# Patient Record
Sex: Female | Born: 1945 | Race: Black or African American | Hispanic: No | Marital: Married | Smoking: Never smoker
Health system: Southern US, Community
[De-identification: ages and names within clinical notes are randomized; demographics above are authoritative.]

## PROBLEM LIST (undated history)

## (undated) DIAGNOSIS — N200 Calculus of kidney: Secondary | ICD-10-CM

## (undated) DIAGNOSIS — M1990 Unspecified osteoarthritis, unspecified site: Secondary | ICD-10-CM

## (undated) DIAGNOSIS — N952 Postmenopausal atrophic vaginitis: Secondary | ICD-10-CM

## (undated) DIAGNOSIS — K5792 Diverticulitis of intestine, part unspecified, without perforation or abscess without bleeding: Secondary | ICD-10-CM

## (undated) DIAGNOSIS — I10 Essential (primary) hypertension: Secondary | ICD-10-CM

## (undated) DIAGNOSIS — N816 Rectocele: Secondary | ICD-10-CM

## (undated) DIAGNOSIS — M8580 Other specified disorders of bone density and structure, unspecified site: Secondary | ICD-10-CM

## (undated) DIAGNOSIS — I8390 Asymptomatic varicose veins of unspecified lower extremity: Secondary | ICD-10-CM

## (undated) DIAGNOSIS — Z201 Contact with and (suspected) exposure to tuberculosis: Secondary | ICD-10-CM

## (undated) DIAGNOSIS — E039 Hypothyroidism, unspecified: Secondary | ICD-10-CM

## (undated) HISTORY — DX: Contact with and (suspected) exposure to tuberculosis: Z20.1

## (undated) HISTORY — DX: Other specified disorders of bone density and structure, unspecified site: M85.80

## (undated) HISTORY — DX: Postmenopausal atrophic vaginitis: N95.2

## (undated) HISTORY — DX: Rectocele: N81.6

## (undated) HISTORY — DX: Calculus of kidney: N20.0

## (undated) HISTORY — DX: Essential (primary) hypertension: I10

## (undated) HISTORY — DX: Asymptomatic varicose veins of unspecified lower extremity: I83.90

## (undated) HISTORY — DX: Diverticulitis of intestine, part unspecified, without perforation or abscess without bleeding: K57.92

## (undated) HISTORY — PX: OTHER SURGICAL HISTORY: SHX169

## (undated) HISTORY — DX: Unspecified osteoarthritis, unspecified site: M19.90

## (undated) HISTORY — DX: Hypothyroidism, unspecified: E03.9

## (undated) HISTORY — PX: CHOLECYSTECTOMY: SHX55

## (undated) HISTORY — PX: TUBAL LIGATION: SHX77

---

## 1998-02-05 ENCOUNTER — Ambulatory Visit (HOSPITAL_COMMUNITY): Admission: RE | Admit: 1998-02-05 | Discharge: 1998-02-05 | Payer: Self-pay | Admitting: Gastroenterology

## 1998-04-10 ENCOUNTER — Encounter: Payer: Self-pay | Admitting: Emergency Medicine

## 1998-04-10 ENCOUNTER — Emergency Department (HOSPITAL_COMMUNITY): Admission: EM | Admit: 1998-04-10 | Discharge: 1998-04-10 | Payer: Self-pay | Admitting: Emergency Medicine

## 1998-04-11 ENCOUNTER — Encounter: Payer: Self-pay | Admitting: Emergency Medicine

## 1998-07-26 ENCOUNTER — Observation Stay (HOSPITAL_COMMUNITY): Admission: RE | Admit: 1998-07-26 | Discharge: 1998-07-27 | Payer: Self-pay

## 1999-04-16 ENCOUNTER — Encounter: Payer: Self-pay | Admitting: Obstetrics and Gynecology

## 1999-04-16 ENCOUNTER — Encounter: Admission: RE | Admit: 1999-04-16 | Discharge: 1999-04-16 | Payer: Self-pay | Admitting: Obstetrics and Gynecology

## 2000-07-01 ENCOUNTER — Encounter: Admission: RE | Admit: 2000-07-01 | Discharge: 2000-07-01 | Payer: Self-pay | Admitting: Obstetrics and Gynecology

## 2000-07-01 ENCOUNTER — Encounter: Payer: Self-pay | Admitting: Obstetrics and Gynecology

## 2001-07-19 ENCOUNTER — Encounter: Payer: Self-pay | Admitting: Obstetrics and Gynecology

## 2001-07-19 ENCOUNTER — Encounter: Admission: RE | Admit: 2001-07-19 | Discharge: 2001-07-19 | Payer: Self-pay | Admitting: Obstetrics and Gynecology

## 2001-07-30 ENCOUNTER — Other Ambulatory Visit: Admission: RE | Admit: 2001-07-30 | Discharge: 2001-07-30 | Payer: Self-pay | Admitting: Obstetrics and Gynecology

## 2001-08-31 ENCOUNTER — Encounter: Admission: RE | Admit: 2001-08-31 | Discharge: 2001-08-31 | Payer: Self-pay | Admitting: Obstetrics and Gynecology

## 2001-08-31 ENCOUNTER — Encounter: Payer: Self-pay | Admitting: Obstetrics and Gynecology

## 2002-04-13 ENCOUNTER — Encounter: Admission: RE | Admit: 2002-04-13 | Discharge: 2002-04-13 | Payer: Self-pay | Admitting: Family Medicine

## 2002-04-13 ENCOUNTER — Encounter: Payer: Self-pay | Admitting: Family Medicine

## 2002-05-16 ENCOUNTER — Ambulatory Visit (HOSPITAL_COMMUNITY): Admission: RE | Admit: 2002-05-16 | Discharge: 2002-05-16 | Payer: Self-pay | Admitting: Gastroenterology

## 2002-07-15 ENCOUNTER — Encounter: Admission: RE | Admit: 2002-07-15 | Discharge: 2002-07-15 | Payer: Self-pay | Admitting: Family Medicine

## 2002-07-15 ENCOUNTER — Encounter: Payer: Self-pay | Admitting: Family Medicine

## 2002-11-02 ENCOUNTER — Encounter: Payer: Self-pay | Admitting: Obstetrics and Gynecology

## 2002-11-02 ENCOUNTER — Encounter: Admission: RE | Admit: 2002-11-02 | Discharge: 2002-11-02 | Payer: Self-pay | Admitting: Obstetrics and Gynecology

## 2003-06-16 ENCOUNTER — Other Ambulatory Visit: Admission: RE | Admit: 2003-06-16 | Discharge: 2003-06-16 | Payer: Self-pay | Admitting: Obstetrics and Gynecology

## 2004-02-13 ENCOUNTER — Encounter: Admission: RE | Admit: 2004-02-13 | Discharge: 2004-02-13 | Payer: Self-pay | Admitting: Obstetrics and Gynecology

## 2004-06-17 ENCOUNTER — Other Ambulatory Visit: Admission: RE | Admit: 2004-06-17 | Discharge: 2004-06-17 | Payer: Self-pay | Admitting: Addiction Medicine

## 2004-07-12 ENCOUNTER — Encounter: Admission: RE | Admit: 2004-07-12 | Discharge: 2004-07-12 | Payer: Self-pay | Admitting: Family Medicine

## 2005-04-22 ENCOUNTER — Encounter: Admission: RE | Admit: 2005-04-22 | Discharge: 2005-04-22 | Payer: Self-pay | Admitting: Obstetrics and Gynecology

## 2005-05-26 ENCOUNTER — Encounter: Admission: RE | Admit: 2005-05-26 | Discharge: 2005-05-26 | Payer: Self-pay | Admitting: Family Medicine

## 2005-06-25 ENCOUNTER — Other Ambulatory Visit: Admission: RE | Admit: 2005-06-25 | Discharge: 2005-06-25 | Payer: Self-pay | Admitting: Obstetrics and Gynecology

## 2005-11-04 ENCOUNTER — Encounter: Admission: RE | Admit: 2005-11-04 | Discharge: 2005-11-04 | Payer: Self-pay | Admitting: Gastroenterology

## 2006-03-05 ENCOUNTER — Encounter: Admission: RE | Admit: 2006-03-05 | Discharge: 2006-03-05 | Payer: Self-pay | Admitting: Ophthalmology

## 2006-03-31 ENCOUNTER — Encounter (INDEPENDENT_AMBULATORY_CARE_PROVIDER_SITE_OTHER): Payer: Self-pay | Admitting: Infectious Diseases

## 2006-04-23 ENCOUNTER — Encounter (INDEPENDENT_AMBULATORY_CARE_PROVIDER_SITE_OTHER): Payer: Self-pay | Admitting: Infectious Diseases

## 2006-05-13 ENCOUNTER — Ambulatory Visit: Payer: Self-pay | Admitting: Infectious Diseases

## 2006-05-13 DIAGNOSIS — B908 Sequelae of tuberculosis of other organs: Secondary | ICD-10-CM

## 2006-05-15 ENCOUNTER — Encounter: Admission: RE | Admit: 2006-05-15 | Discharge: 2006-05-15 | Payer: Self-pay | Admitting: Obstetrics and Gynecology

## 2006-07-07 ENCOUNTER — Other Ambulatory Visit: Admission: RE | Admit: 2006-07-07 | Discharge: 2006-07-07 | Payer: Self-pay | Admitting: Obstetrics and Gynecology

## 2007-06-29 ENCOUNTER — Encounter: Admission: RE | Admit: 2007-06-29 | Discharge: 2007-06-29 | Payer: Self-pay | Admitting: Family Medicine

## 2007-07-09 ENCOUNTER — Other Ambulatory Visit: Admission: RE | Admit: 2007-07-09 | Discharge: 2007-07-09 | Payer: Self-pay | Admitting: Obstetrics and Gynecology

## 2007-07-21 ENCOUNTER — Encounter: Admission: RE | Admit: 2007-07-21 | Discharge: 2007-07-21 | Payer: Self-pay | Admitting: Family Medicine

## 2007-08-04 ENCOUNTER — Encounter: Admission: RE | Admit: 2007-08-04 | Discharge: 2007-08-04 | Payer: Self-pay | Admitting: Family Medicine

## 2007-12-20 ENCOUNTER — Ambulatory Visit: Payer: Self-pay | Admitting: Obstetrics and Gynecology

## 2008-07-12 ENCOUNTER — Other Ambulatory Visit: Admission: RE | Admit: 2008-07-12 | Discharge: 2008-07-12 | Payer: Self-pay | Admitting: Obstetrics and Gynecology

## 2008-07-12 ENCOUNTER — Encounter: Payer: Self-pay | Admitting: Obstetrics and Gynecology

## 2008-07-12 ENCOUNTER — Ambulatory Visit: Payer: Self-pay | Admitting: Obstetrics and Gynecology

## 2008-07-17 ENCOUNTER — Encounter: Admission: RE | Admit: 2008-07-17 | Discharge: 2008-07-17 | Payer: Self-pay | Admitting: Obstetrics and Gynecology

## 2009-07-18 ENCOUNTER — Ambulatory Visit: Payer: Self-pay | Admitting: Obstetrics and Gynecology

## 2009-07-19 ENCOUNTER — Encounter: Admission: RE | Admit: 2009-07-19 | Discharge: 2009-07-19 | Payer: Self-pay | Admitting: Family Medicine

## 2009-10-09 ENCOUNTER — Ambulatory Visit: Payer: Self-pay | Admitting: Obstetrics and Gynecology

## 2010-03-17 ENCOUNTER — Encounter: Payer: Self-pay | Admitting: Obstetrics and Gynecology

## 2010-04-09 ENCOUNTER — Inpatient Hospital Stay (INDEPENDENT_AMBULATORY_CARE_PROVIDER_SITE_OTHER)
Admission: RE | Admit: 2010-04-09 | Discharge: 2010-04-09 | Disposition: A | Discharge status: Home / Self Care | Payer: Medicare Other | Source: Ambulatory Visit | Attending: Family Medicine | Admitting: Family Medicine

## 2010-04-09 DIAGNOSIS — J309 Allergic rhinitis, unspecified: Secondary | ICD-10-CM

## 2010-04-14 ENCOUNTER — Emergency Department (HOSPITAL_COMMUNITY)
Admission: EM | Admit: 2010-04-14 | Discharge: 2010-04-15 | Disposition: A | Discharge status: Home / Self Care | Payer: Medicare Other | Attending: Emergency Medicine | Admitting: Emergency Medicine

## 2010-04-14 DIAGNOSIS — R040 Epistaxis: Secondary | ICD-10-CM | POA: Insufficient documentation

## 2010-04-14 DIAGNOSIS — J309 Allergic rhinitis, unspecified: Secondary | ICD-10-CM | POA: Insufficient documentation

## 2010-04-14 DIAGNOSIS — I10 Essential (primary) hypertension: Secondary | ICD-10-CM | POA: Insufficient documentation

## 2010-04-14 DIAGNOSIS — R42 Dizziness and giddiness: Secondary | ICD-10-CM | POA: Insufficient documentation

## 2010-04-14 DIAGNOSIS — Z79899 Other long term (current) drug therapy: Secondary | ICD-10-CM | POA: Insufficient documentation

## 2010-04-14 DIAGNOSIS — E039 Hypothyroidism, unspecified: Secondary | ICD-10-CM | POA: Insufficient documentation

## 2010-04-15 ENCOUNTER — Emergency Department (HOSPITAL_COMMUNITY): Payer: Medicare Other

## 2010-04-15 ENCOUNTER — Other Ambulatory Visit: Payer: Self-pay | Admitting: Family Medicine

## 2010-04-15 DIAGNOSIS — R52 Pain, unspecified: Secondary | ICD-10-CM

## 2010-04-15 DIAGNOSIS — R350 Frequency of micturition: Secondary | ICD-10-CM

## 2010-04-15 DIAGNOSIS — I10 Essential (primary) hypertension: Secondary | ICD-10-CM

## 2010-04-15 DIAGNOSIS — S37009A Unspecified injury of unspecified kidney, initial encounter: Secondary | ICD-10-CM

## 2010-04-15 DIAGNOSIS — I701 Atherosclerosis of renal artery: Secondary | ICD-10-CM

## 2010-04-15 DIAGNOSIS — R040 Epistaxis: Secondary | ICD-10-CM

## 2010-04-15 LAB — COMPREHENSIVE METABOLIC PANEL
ALT: 17 U/L (ref 0–35)
AST: 23 U/L (ref 0–37)
CO2: 23 mEq/L (ref 19–32)
Calcium: 9 mg/dL (ref 8.4–10.5)
Creatinine, Ser: 0.78 mg/dL (ref 0.4–1.2)
GFR calc Af Amer: >60 mL/min (ref >60)
Glucose, Bld: 146 mg/dL — ABNORMAL HIGH (ref 70–99)
Potassium: 3.4 mEq/L — ABNORMAL LOW (ref 3.5–5.1)
Sodium: 134 mEq/L — ABNORMAL LOW (ref 135–145)

## 2010-04-15 LAB — DIFFERENTIAL
Basophils Absolute: 0.1 10*3/uL (ref 0.0–0.1)
Monocytes Relative: 9 % (ref 3–12)
Neutro Abs: 8 10*3/uL — ABNORMAL HIGH (ref 1.7–7.7)
Neutrophils Relative %: 67 % (ref 43–77)

## 2010-04-15 LAB — CBC
HCT: 40.1 % (ref 36.0–46.0)
Platelets: 255 10*3/uL (ref 150–400)
RBC: 4.6 MIL/uL (ref 3.87–5.11)
RDW: 12.8 % (ref 11.5–15.5)
WBC: 11.9 10*3/uL — ABNORMAL HIGH (ref 4.0–10.5)

## 2010-04-15 LAB — APTT: aPTT: 35 seconds (ref 24–37)

## 2010-04-15 LAB — PROTIME-INR
INR: 0.97 (ref 0.00–1.49)
Prothrombin Time: 13.1 seconds (ref 11.6–15.2)

## 2010-06-27 ENCOUNTER — Other Ambulatory Visit: Payer: Self-pay | Admitting: Obstetrics and Gynecology

## 2010-06-27 DIAGNOSIS — Z1231 Encounter for screening mammogram for malignant neoplasm of breast: Secondary | ICD-10-CM

## 2010-07-12 NOTE — Op Note (Signed)
   NAME:  Lynn Collins, Lynn Collins                      ACCOUNT NO.:  [id number]   MEDICAL RECORD NO.:  [id number]                   PATIENT TYPE:  AMB   LOCATION:  ENDO                                 FACILITY:  [facility]   PHYSICIAN:  Bernette Redbird, M.D.                DATE OF BIRTH:  Feb 10, 1946   DATE OF PROCEDURE:  05/16/2002  DATE OF DISCHARGE:                                 OPERATIVE REPORT   PROCEDURE PERFORMED:  Colonoscopy.   ENDOSCOPIST:  Bernette Redbird, M.D.   INDICATIONS FOR PROCEDURE:  The patient is a 65 year old female who desires  full colonoscopy for colon cancer screening.  A flexible sigmoidoscopy to 60  cm a couple of years ago was negative and there were no worrisome symptoms  but there is a family history of colon cancer in an aunt.   FINDINGS:  Sigmoid diverticulosis.   MEDICINES USED:  Versed 5 mg, fentanyl 50 mcg.   DESCRIPTION OF PROCEDURE:  The nature, purpose and risks of the procedure  had been discussed with the patient who provided written consent.  Sedation  was fentanyl 50 mcg and Versed 5 mg IV without arrhythmias or desaturation.  The Olympus adjustable tension pediatric video colonoscope was advanced  easily around the colon to the area just above the cecum, whereupon we had  to turn the patient into the supine position and apply external abdominal  compression to control looping to advance into the base of the cecum which  was identified by clear visualization of the appendiceal orifice and the  absence of further lumen as well as visualization of the ileocecal valve.   The quality of the prep was excellent and it is felt that all areas were  well seen.  This was a normal examination except for some mild to moderate  sigmoid diverticulosis.  No polyps, cancer, colitis or vascular  malformations were observed.  Retroflexion in the rectum as well as  reinspection of the rectosigmoid was unremarkable.  No biopsies were  obtained.  The patient tolerated  the procedure well.  There were no apparent  complications.    IMPRESSION:  Sigmoid diverticulosis, otherwise normal examination to the  cecum in a patient with a remote family history of colon cancer.   PLAN:  Flexible sigmoidoscopy versus colonoscopy in five years for continued  screening.                                               Bernette Redbird, M.D.    RB/MEDQ  D:  05/16/2002  T:  05/16/2002  Job:  [id number]   cc:   Thelma Barge P. Modesto Charon, M.D.  [address]  [place]  [place] [address]  Fax: [phone]

## 2010-07-24 ENCOUNTER — Encounter: Payer: Medicare Other | Admitting: Obstetrics and Gynecology

## 2010-08-06 ENCOUNTER — Ambulatory Visit
Admission: RE | Admit: 2010-08-06 | Discharge: 2010-08-06 | Disposition: A | Discharge status: Home / Self Care | Payer: Medicare Other | Source: Ambulatory Visit | Attending: Obstetrics and Gynecology | Admitting: Obstetrics and Gynecology

## 2010-08-06 DIAGNOSIS — Z1231 Encounter for screening mammogram for malignant neoplasm of breast: Secondary | ICD-10-CM

## 2010-08-08 ENCOUNTER — Other Ambulatory Visit (HOSPITAL_COMMUNITY)
Admission: RE | Admit: 2010-08-08 | Discharge: 2010-08-08 | Disposition: A | Discharge status: Home / Self Care | Payer: Medicare Other | Source: Ambulatory Visit | Attending: Obstetrics and Gynecology | Admitting: Obstetrics and Gynecology

## 2010-08-08 ENCOUNTER — Other Ambulatory Visit: Payer: Self-pay | Admitting: Obstetrics and Gynecology

## 2010-08-08 ENCOUNTER — Encounter (INDEPENDENT_AMBULATORY_CARE_PROVIDER_SITE_OTHER): Payer: Medicare Other | Admitting: Obstetrics and Gynecology

## 2010-08-08 DIAGNOSIS — N952 Postmenopausal atrophic vaginitis: Secondary | ICD-10-CM

## 2010-08-08 DIAGNOSIS — N951 Menopausal and female climacteric states: Secondary | ICD-10-CM

## 2010-08-08 DIAGNOSIS — Z124 Encounter for screening for malignant neoplasm of cervix: Secondary | ICD-10-CM | POA: Insufficient documentation

## 2010-08-08 DIAGNOSIS — N816 Rectocele: Secondary | ICD-10-CM

## 2010-08-08 DIAGNOSIS — M949 Disorder of cartilage, unspecified: Secondary | ICD-10-CM

## 2010-08-08 DIAGNOSIS — M899 Disorder of bone, unspecified: Secondary | ICD-10-CM

## 2010-08-08 DIAGNOSIS — K648 Other hemorrhoids: Secondary | ICD-10-CM

## 2011-04-22 DIAGNOSIS — I10 Essential (primary) hypertension: Secondary | ICD-10-CM | POA: Diagnosis not present

## 2011-04-22 DIAGNOSIS — M542 Cervicalgia: Secondary | ICD-10-CM | POA: Diagnosis not present

## 2011-06-19 DIAGNOSIS — H44119 Panuveitis, unspecified eye: Secondary | ICD-10-CM | POA: Diagnosis not present

## 2011-06-19 DIAGNOSIS — H2510 Age-related nuclear cataract, unspecified eye: Secondary | ICD-10-CM | POA: Diagnosis not present

## 2011-06-30 ENCOUNTER — Other Ambulatory Visit: Payer: Self-pay | Admitting: Obstetrics and Gynecology

## 2011-06-30 DIAGNOSIS — Z1231 Encounter for screening mammogram for malignant neoplasm of breast: Secondary | ICD-10-CM

## 2011-08-13 ENCOUNTER — Encounter: Payer: 59 | Admitting: Obstetrics and Gynecology

## 2011-08-14 ENCOUNTER — Ambulatory Visit: Payer: 59

## 2011-08-20 ENCOUNTER — Encounter: Payer: Self-pay | Admitting: Obstetrics and Gynecology

## 2011-08-20 ENCOUNTER — Ambulatory Visit (INDEPENDENT_AMBULATORY_CARE_PROVIDER_SITE_OTHER): Payer: Medicare Other | Admitting: Obstetrics and Gynecology

## 2011-08-20 VITALS — BP 120/80 | Ht 63.0 in | Wt 160.0 lb

## 2011-08-20 DIAGNOSIS — M899 Disorder of bone, unspecified: Secondary | ICD-10-CM

## 2011-08-20 DIAGNOSIS — N952 Postmenopausal atrophic vaginitis: Secondary | ICD-10-CM | POA: Diagnosis not present

## 2011-08-20 DIAGNOSIS — M949 Disorder of cartilage, unspecified: Secondary | ICD-10-CM | POA: Diagnosis not present

## 2011-08-20 DIAGNOSIS — M8580 Other specified disorders of bone density and structure, unspecified site: Secondary | ICD-10-CM

## 2011-08-20 DIAGNOSIS — N816 Rectocele: Secondary | ICD-10-CM

## 2011-08-20 MED ORDER — ESTRADIOL 0.1 MG/GM VA CREA: 1.0000 g | TOPICAL_CREAM | Freq: Every day | VAGINAL | Status: DC

## 2011-08-20 NOTE — Progress Notes (Signed)
Patient came to see me today for further followup. We have been watching her with osteopenia. She is due for her next bone density in August of this year. Her last bone density showed some improvement in the hip  with decrease bone in the spine and radius. She takes calcium and vitamin D. She has had no fractures. She is also using estrogen cream for symptomatic atrophic vaginitis with excellent results. She also has a rectocele that we have made her aware of. She is not symptomatic. She is having no trouble with either constipation or stool. She has always had normal Pap smears. Her last Pap smear was June, 2012. She is having no vaginal bleeding. She is having no pelvic pain. She does her lab work through her PCP. She is scheduled her mammogram.  ROS: 12 systems reviewed done. 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 Other pertinent positives are listed above.  Physical examination: Lynn Collins present. HEENT within normal limits. Neck: Thyroid not large. No masses. Supraclavicular nodes: not enlarged. Breasts: Examined in both sitting and lying  position. No skin changes and no masses. Abdomen: Soft no guarding rebound or masses or hernia. Pelvic: External: Within normal limits. BUS: Within normal limits. Vaginal:within normal limits. Good estrogen effect. Stable first degree rectocele. Cervix: clean. Uterus: Normal size and shape. Adnexa: No masses. Rectovaginal exam: Confirmatory and negative. Extremities: Within normal limits.  Assessment: #1. Rectocele #2. Osteopenia #3. Atrophic vaginitis  Plan: Mammogram. Bone density. Continue estrogen cream. No Pap done.

## 2011-08-26 ENCOUNTER — Ambulatory Visit
Admission: RE | Admit: 2011-08-26 | Discharge: 2011-08-26 | Disposition: A | Discharge status: Home / Self Care | Payer: Medicare Other | Source: Ambulatory Visit | Attending: Obstetrics and Gynecology | Admitting: Obstetrics and Gynecology

## 2011-08-26 DIAGNOSIS — Z1231 Encounter for screening mammogram for malignant neoplasm of breast: Secondary | ICD-10-CM | POA: Diagnosis not present

## 2011-10-13 ENCOUNTER — Other Ambulatory Visit: Payer: Self-pay | Admitting: Obstetrics and Gynecology

## 2011-10-13 DIAGNOSIS — M8580 Other specified disorders of bone density and structure, unspecified site: Secondary | ICD-10-CM

## 2011-10-14 ENCOUNTER — Other Ambulatory Visit: Payer: Self-pay | Admitting: Obstetrics and Gynecology

## 2011-10-14 ENCOUNTER — Ambulatory Visit (INDEPENDENT_AMBULATORY_CARE_PROVIDER_SITE_OTHER): Payer: Medicare Other | Admitting: Obstetrics and Gynecology

## 2011-10-14 ENCOUNTER — Ambulatory Visit (INDEPENDENT_AMBULATORY_CARE_PROVIDER_SITE_OTHER): Payer: Medicare Other

## 2011-10-14 DIAGNOSIS — M899 Disorder of bone, unspecified: Secondary | ICD-10-CM

## 2011-10-14 DIAGNOSIS — M949 Disorder of cartilage, unspecified: Secondary | ICD-10-CM | POA: Diagnosis not present

## 2011-10-14 DIAGNOSIS — R300 Dysuria: Secondary | ICD-10-CM | POA: Diagnosis not present

## 2011-10-14 DIAGNOSIS — M8580 Other specified disorders of bone density and structure, unspecified site: Secondary | ICD-10-CM

## 2011-10-14 NOTE — Progress Notes (Signed)
On Saturday patient started to have left lower quadrant pain. She also had some pelvic pressure when voiding. She had no dysuria. She does have urinary frequency but that is been present for a while. She's also had intermittent lower back pain. When she saw her PCP in April she had microscopic hematuria and he treated her with amoxicillin. She had seen Dr. Brunilda Payor in 2010 when there was some question about tuberculosis and he told her that she had a small stone in her left kidney that did not require either treatment or followup. She's had no change in her bowel habits. She is having no nausea and vomiting. Her urinalysis today showed 7-10 white blood cells and 3-6 red blood cells.  Exam: Kennon Portela present. Abdomen is soft without guarding rebound or masses.Pelvic exam: External within normal limits. BUS within normal limits. Vaginal exam within normal limits. Cervix is clean without lesions. Uterus is normal size and shape. Adnexa failed to reveal masses. Rectovaginal examination is confirmatory and without masses.   Assessment: Left lower quadrant pain. Abnormal urinalysis. Microscopic hematuria.  Plan: We will wait  to treat until  culture back. Pending results to antibiotics if we use them we'll consider pelvic ultrasound or a referral back to the urologist.

## 2011-10-14 NOTE — Patient Instructions (Signed)
We will call you with culture results. 

## 2011-10-15 LAB — URINALYSIS W MICROSCOPIC + REFLEX CULTURE
Casts: NONE SEEN
Crystals: NONE SEEN
Glucose, UA: NEGATIVE mg/dL
Ketones, ur: NEGATIVE mg/dL
pH: 7.5 (ref 5.0–8.0)

## 2011-10-16 ENCOUNTER — Other Ambulatory Visit: Payer: Self-pay | Admitting: Obstetrics and Gynecology

## 2011-10-16 DIAGNOSIS — N949 Unspecified condition associated with female genital organs and menstrual cycle: Secondary | ICD-10-CM

## 2011-10-16 LAB — URINE CULTURE: Colony Count: NO GROWTH

## 2011-10-22 ENCOUNTER — Ambulatory Visit (INDEPENDENT_AMBULATORY_CARE_PROVIDER_SITE_OTHER): Payer: Medicare Other

## 2011-10-22 ENCOUNTER — Ambulatory Visit (INDEPENDENT_AMBULATORY_CARE_PROVIDER_SITE_OTHER): Payer: Medicare Other | Admitting: Obstetrics and Gynecology

## 2011-10-22 DIAGNOSIS — D259 Leiomyoma of uterus, unspecified: Secondary | ICD-10-CM

## 2011-10-22 DIAGNOSIS — D3910 Neoplasm of uncertain behavior of unspecified ovary: Secondary | ICD-10-CM | POA: Diagnosis not present

## 2011-10-22 DIAGNOSIS — D219 Benign neoplasm of connective and other soft tissue, unspecified: Secondary | ICD-10-CM

## 2011-10-22 DIAGNOSIS — N839 Noninflammatory disorder of ovary, fallopian tube and broad ligament, unspecified: Secondary | ICD-10-CM | POA: Diagnosis not present

## 2011-10-22 DIAGNOSIS — R1032 Left lower quadrant pain: Secondary | ICD-10-CM | POA: Diagnosis not present

## 2011-10-22 DIAGNOSIS — N949 Unspecified condition associated with female genital organs and menstrual cycle: Secondary | ICD-10-CM

## 2011-10-22 DIAGNOSIS — D279 Benign neoplasm of unspecified ovary: Secondary | ICD-10-CM

## 2011-10-22 DIAGNOSIS — D251 Intramural leiomyoma of uterus: Secondary | ICD-10-CM

## 2011-10-22 NOTE — Progress Notes (Signed)
Patient came back today for a pelvic ultrasound due  to left lower  quadrant pain. Her urine culture was negative. She has a history of diverticulitis. She has never had a previous ultrasound.  On ultrasound her uterus shows a small intramural myoma of 1.1 cm. Her endometrial echo is thin at 2.7 mm. Her right ovary is normal. On her left ovary there is a small solid focus with calcifications of 1.7 cm most consistent with a small fibroma. It is completely avascular. Her cul-de-sac is free of fluid.  Assessment: Left lower quadrant pain. 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  Plan: I told the patient and her husband that I do not think the fibroma is responsible for her pain. I told her short of laparotomy and removal of the ovary I could not guarantee her that it's benign but I felt that it was very nonsuspicious for an ovarian malignancy. I offered her a second opinion with a GYN oncologist. For the moment she declined. She understands that there is always a risk of having surgery. We decided just to followup with ultrasound in 3 months.

## 2011-10-22 NOTE — Patient Instructions (Addendum)
Schedule ultrasound in 3 months. 

## 2011-10-24 ENCOUNTER — Telehealth: Payer: Self-pay | Admitting: *Deleted

## 2011-10-24 NOTE — Telephone Encounter (Signed)
(  pt aware you are out of the office)Pt called to inform you that she would like to proceed with a second opinion with a GYN oncologist.

## 2011-10-25 NOTE — Telephone Encounter (Signed)
Have her see Dr. Stanford Breed or Dr. Velora Heckler

## 2011-10-28 NOTE — Telephone Encounter (Signed)
Spoke with the below note,pt asked if appointment could be set up around Oct.

## 2011-10-29 NOTE — Telephone Encounter (Signed)
Pt has appointment with Dr.Palo Duard Brady @ 11/27/11 1:00 pm. Pt informed with the below.

## 2011-11-18 DIAGNOSIS — Z23 Encounter for immunization: Secondary | ICD-10-CM | POA: Diagnosis not present

## 2011-11-18 DIAGNOSIS — E039 Hypothyroidism, unspecified: Secondary | ICD-10-CM | POA: Diagnosis not present

## 2011-11-18 DIAGNOSIS — E782 Mixed hyperlipidemia: Secondary | ICD-10-CM | POA: Diagnosis not present

## 2011-11-18 DIAGNOSIS — I10 Essential (primary) hypertension: Secondary | ICD-10-CM | POA: Diagnosis not present

## 2011-12-03 ENCOUNTER — Encounter: Payer: Self-pay | Admitting: Gynecologic Oncology

## 2011-12-03 ENCOUNTER — Ambulatory Visit: Payer: Medicare Other | Admitting: Lab

## 2011-12-03 ENCOUNTER — Ambulatory Visit: Payer: Medicare Other | Attending: Gynecologic Oncology | Admitting: Gynecologic Oncology

## 2011-12-03 VITALS — BP 118/72 | HR 70 | Temp 97.8°F | Resp 16 | Ht 63.78 in | Wt 162.0 lb

## 2011-12-03 DIAGNOSIS — Z7982 Long term (current) use of aspirin: Secondary | ICD-10-CM | POA: Insufficient documentation

## 2011-12-03 DIAGNOSIS — E282 Polycystic ovarian syndrome: Secondary | ICD-10-CM | POA: Diagnosis not present

## 2011-12-03 DIAGNOSIS — I10 Essential (primary) hypertension: Secondary | ICD-10-CM | POA: Diagnosis not present

## 2011-12-03 DIAGNOSIS — N839 Noninflammatory disorder of ovary, fallopian tube and broad ligament, unspecified: Secondary | ICD-10-CM | POA: Insufficient documentation

## 2011-12-03 DIAGNOSIS — M949 Disorder of cartilage, unspecified: Secondary | ICD-10-CM | POA: Insufficient documentation

## 2011-12-03 DIAGNOSIS — Z79899 Other long term (current) drug therapy: Secondary | ICD-10-CM | POA: Insufficient documentation

## 2011-12-03 DIAGNOSIS — M899 Disorder of bone, unspecified: Secondary | ICD-10-CM | POA: Diagnosis not present

## 2011-12-03 DIAGNOSIS — R1909 Other intra-abdominal and pelvic swelling, mass and lump: Secondary | ICD-10-CM

## 2011-12-03 DIAGNOSIS — R1900 Intra-abdominal and pelvic swelling, mass and lump, unspecified site: Secondary | ICD-10-CM | POA: Diagnosis not present

## 2011-12-03 DIAGNOSIS — E039 Hypothyroidism, unspecified: Secondary | ICD-10-CM | POA: Diagnosis not present

## 2011-12-03 DIAGNOSIS — D4959 Neoplasm of unspecified behavior of other genitourinary organ: Secondary | ICD-10-CM | POA: Insufficient documentation

## 2011-12-03 DIAGNOSIS — N838 Other noninflammatory disorders of ovary, fallopian tube and broad ligament: Secondary | ICD-10-CM

## 2011-12-03 NOTE — Progress Notes (Signed)
Consult Note: Gyn-Onc  Lynn Collins 66 y.o. female  CC:  Chief Complaint  Patient presents with  . Ovarian Mass     New consult(2nd op)    HPI: Patient is seen today in consultation at the request of Dr. Eda Paschal. Lynn Collins is a 66 year old gravida 4 para 3 aborta 1 who had her annual gynecologic exam in December 2013 that was negative. In August of 2013 she went to her physician's office to her routine bone density study. At that time she had some left lower quadrant discomfort and she felt that she might have a urinary tract infection. A urinalysis was performed that showed some blood however the urine culture was negative. She subsequently underwent a pelvic ultrasound on August 28. It revealed an anteverted uterus with an interim I-year-old fibroid measuring 9 x 13 mm. The right ovary was atrophic with numerous calcifications. The left ovary was atrophic with a solid focus the calcification measuring 1.9 x 1.4 cm. The area was avascular. There is no free fluid in the cul-de-sac. Dr. Eda Paschal felt that this was most likely benign but the patient requested a second opinion.  Review of Systems She does have a history of ovarian cysts. She was diagnosed with polycystic ovarian syndrome in 1968 and it took about 3 years to conceive. Since that time she had no issues with conception. She's been menopausal since the age of 40. She took hormone replacement therapy for about 10 years. This discomfort that she had a left lower quadrant was different than her diverticula related symptoms. Typically with her diverticulosis if she ate foods containing corn or other high-fiber material she would have some gas and feel that her colon was irritated. This discomfort felt different to her than this. The pain was intermittent and is now has resolved and  decreased. She does have low back pain that radiates to her left thigh which is increased with gardening and sometimes radiates down to her lower leg. She  has been told that  this is most likely sciatica. The pain she experienced in her left lower quadrant is also different than this pain. She has any chest pain shortness of breath nausea vomiting fevers chills. She denies a change about bladder habits or any vaginal bleeding. 10 point review of systems is otherwise negative  Current Meds:  Outpatient Encounter Prescriptions as of 12/03/2011  Medication Sig Dispense Refill  . AMLODIPINE BESYLATE PO Take by mouth.      . Ascorbic Acid (VITAMIN C PO) Take by mouth.        [name] Kitchen aspirin 81 MG chewable tablet Chew 81 mg by mouth as needed.        . Calcium Carbonate-Vit D-Min (CALTRATE PLUS PO) Take by mouth 2 (two) times daily.        [name] Kitchen estradiol (ESTRACE) 0.1 MG/GM vaginal cream Place 0.25 Applicatorfuls vaginally at bedtime.  42.5 g  4  . levothyroxine (SYNTHROID, LEVOTHROID) 125 MCG tablet Take 125 mcg by mouth daily.        . multivitamin (THERAGRAN) per tablet Take 1 tablet by mouth daily.          Allergy:  Allergies  Allergen Reactions  . Kapidex (Dexlansoprazole)   . Sulfonamide Derivatives     Social Hx:   History   Social History  . Marital Status: Married    Spouse Name: N/A    Number of Children: N/A  . Years of Education: N/A   Occupational History  . Not on  file.   Social History Main Topics  . Smoking status: Never Smoker   . Smokeless tobacco: Not on file  . Alcohol Use: Yes     rare  . Drug Use: No  . Sexually Active: Yes    Birth Control/ Protection: Surgical   Other Topics Concern  . Not on file   Social History Narrative  . No narrative on file    Past Surgical Hx:  Past Surgical History  Procedure Date  . Tubal ligation   . Cholecystectomy     Past Medical Hx:  Past Medical History  Diagnosis Date  . Atrophic vaginitis   . Rectocele   . Hemorrhoids   . Hypothyroidism   . Varicose veins   . Exposure to TB     possible per pt  . Hypertension   . Osteopenia     Family Hx:  Family History    Problem Relation Age of Onset  . Diabetes Mother   . Hypertension Mother   . Uterine cancer Mother   . Heart disease Mother   . Cancer Father     prostate  . Colon cancer Paternal Aunt   . Heart attack Brother   . Cancer Daughter     Thyroid cancer    Vitals:  Blood pressure 118/72, pulse 70, temperature 97.8 F (36.6 C), temperature source Oral, resp. rate 16, height 5' 3.78" (1.62 m), weight 162 lb (73.483 kg).  Physical Exam: Well-nourished well-developed female in no acute distress.  Neck: Supple, no lymphadenopathy no thyromegaly.  Lungs: Clear to auscultation bilaterally.  Cardiovascular: Regular rate and rhythm.  Abdomen: Well-healed surgical incisions in the left and right upper quadrants. Abdomen is soft nontender nondistended no palpable masses or prostatomegaly. There is no fluid wave.  Groins: No lymphadenopathy.  Extremities: No edema.  Pelvic: Normal external female genitalia with a prominent hemorrhoid. The cervix is palpably normal. The corpus is of normal size shape and consistency. There is no adnexal masses. There is no nodularity. There is minimal tenderness to bimanual examination. Rectal confirms.  Assessment/Plan: 66 year old with a 1.9 x 1.4 cm solid focus within the left ovary that by ultrasound is most likely consistent with an ovarian fibroma. She's not had any tumor markers drawn. We will check a CA 125 and call her with the results of the CA 125. Since her pain has resolved if her CA 125 is normal I think this can be followed conservatively she may not necessarily require surgery. However, if her CA-1255 is markedly elevated we'll need to reconsider management of this mass. I met with both her and her husband today. Their questions were elicited in answer to their satisfaction and they're comfortable with this plan.  They know to contact us if her pain increases or other symptoms appear.  Caydence Koenig A., MD 12/03/2011, 9:20 AM

## 2011-12-03 NOTE — Patient Instructions (Signed)
We will call you with the results of your blood work (CA-125)

## 2011-12-04 ENCOUNTER — Telehealth: Payer: Self-pay | Admitting: Gynecologic Oncology

## 2011-12-04 LAB — CA 125: CA 125: 6.9 U/mL (ref 0.0–30.2)

## 2011-12-04 NOTE — Telephone Encounter (Signed)
Message left for patient with CA 125 results: 6.9.  Instructed to call for any questions or concerns. 

## 2011-12-10 ENCOUNTER — Telehealth: Payer: Self-pay | Admitting: Gynecologic Oncology

## 2011-12-10 DIAGNOSIS — E782 Mixed hyperlipidemia: Secondary | ICD-10-CM | POA: Insufficient documentation

## 2011-12-10 DIAGNOSIS — I10 Essential (primary) hypertension: Secondary | ICD-10-CM | POA: Diagnosis not present

## 2011-12-10 DIAGNOSIS — E039 Hypothyroidism, unspecified: Secondary | ICD-10-CM | POA: Insufficient documentation

## 2011-12-10 DIAGNOSIS — M899 Disorder of bone, unspecified: Secondary | ICD-10-CM | POA: Diagnosis not present

## 2011-12-10 DIAGNOSIS — E785 Hyperlipidemia, unspecified: Secondary | ICD-10-CM | POA: Diagnosis not present

## 2011-12-10 DIAGNOSIS — D279 Benign neoplasm of unspecified ovary: Secondary | ICD-10-CM | POA: Diagnosis not present

## 2011-12-10 DIAGNOSIS — D259 Leiomyoma of uterus, unspecified: Secondary | ICD-10-CM | POA: Diagnosis not present

## 2011-12-10 DIAGNOSIS — K5732 Diverticulitis of large intestine without perforation or abscess without bleeding: Secondary | ICD-10-CM | POA: Diagnosis not present

## 2011-12-10 DIAGNOSIS — Z79899 Other long term (current) drug therapy: Secondary | ICD-10-CM | POA: Diagnosis not present

## 2011-12-10 NOTE — Telephone Encounter (Signed)
Note opened in error.

## 2011-12-17 ENCOUNTER — Other Ambulatory Visit: Payer: Self-pay | Admitting: Gastroenterology

## 2011-12-17 DIAGNOSIS — R1032 Left lower quadrant pain: Secondary | ICD-10-CM

## 2011-12-17 DIAGNOSIS — R195 Other fecal abnormalities: Secondary | ICD-10-CM | POA: Diagnosis not present

## 2011-12-23 ENCOUNTER — Ambulatory Visit
Admission: RE | Admit: 2011-12-23 | Discharge: 2011-12-23 | Disposition: A | Discharge status: Home / Self Care | Payer: Medicare Other | Source: Ambulatory Visit | Attending: Gastroenterology | Admitting: Gastroenterology

## 2011-12-23 DIAGNOSIS — R1032 Left lower quadrant pain: Secondary | ICD-10-CM

## 2011-12-23 DIAGNOSIS — K5730 Diverticulosis of large intestine without perforation or abscess without bleeding: Secondary | ICD-10-CM | POA: Diagnosis not present

## 2011-12-23 MED ORDER — IOHEXOL 300 MG/ML  SOLN
100.0000 mL | Freq: Once | INTRAMUSCULAR | Status: AC | PRN
  Administered 2011-12-23: 100 mL via INTRAVENOUS

## 2012-01-19 ENCOUNTER — Ambulatory Visit (INDEPENDENT_AMBULATORY_CARE_PROVIDER_SITE_OTHER): Payer: Medicare Other | Admitting: Obstetrics and Gynecology

## 2012-01-19 ENCOUNTER — Ambulatory Visit (INDEPENDENT_AMBULATORY_CARE_PROVIDER_SITE_OTHER): Payer: Medicare Other

## 2012-01-19 ENCOUNTER — Other Ambulatory Visit: Payer: Self-pay | Admitting: Obstetrics and Gynecology

## 2012-01-19 DIAGNOSIS — D219 Benign neoplasm of connective and other soft tissue, unspecified: Secondary | ICD-10-CM

## 2012-01-19 DIAGNOSIS — D259 Leiomyoma of uterus, unspecified: Secondary | ICD-10-CM

## 2012-01-19 DIAGNOSIS — D3910 Neoplasm of uncertain behavior of unspecified ovary: Secondary | ICD-10-CM | POA: Diagnosis not present

## 2012-01-19 DIAGNOSIS — D279 Benign neoplasm of unspecified ovary: Secondary | ICD-10-CM

## 2012-01-19 DIAGNOSIS — D252 Subserosal leiomyoma of uterus: Secondary | ICD-10-CM

## 2012-01-19 NOTE — Progress Notes (Signed)
Patient came back to see me today for followup ultrasound. Since I last seen her she's through Dr. 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 CA 125 was normal. Her gastroenterologist did a CT of her abdomen and  pelvis for other reasons and she had no pelvic pathology. She had wanted her to return pelvic ultrasound here in 3 months and that's why she is here. On ultrasound today her uterus still shows a small intramural fibroid of 1 cm. Her endometrial echo is 2.8 mm. Post fluid residual of her bladder shows a cystic area of 9 x 7 mm which may be a cyst. On the left ovary there is continued presence of a solid mass of 1.6 cm which is unchanged from August. However there is now a solid focus on her right ovary of 1.6 cm which is new. There is blood flow within the mass. Her cul-de-sac is free of fluid.  Assessment: Bilateral solid small ovarian neoplasms. Possible mass in bladder.  Plan: We discussed the above in detail. Although I still suspect she is not have a malignancy I have asked her to return to see Dr. Mable Paris for her opinion. Depending on what she says I told the patient she needs someone to do a cystoscopy. She does have a urologist, Dr. Brunilda Payor And she will either have Dr.Gehrig or him do this. Since I am retiring I suggested she stay with Dr. Duard Brady for followup of adnexal pathology.

## 2012-01-19 NOTE — Patient Instructions (Addendum)
Make appointment with Dr. Lossie Faes.

## 2012-01-26 DIAGNOSIS — D251 Intramural leiomyoma of uterus: Secondary | ICD-10-CM | POA: Diagnosis not present

## 2012-01-26 DIAGNOSIS — N838 Other noninflammatory disorders of ovary, fallopian tube and broad ligament: Secondary | ICD-10-CM | POA: Diagnosis not present

## 2012-01-26 DIAGNOSIS — R1900 Intra-abdominal and pelvic swelling, mass and lump, unspecified site: Secondary | ICD-10-CM | POA: Diagnosis not present

## 2012-01-30 DIAGNOSIS — R195 Other fecal abnormalities: Secondary | ICD-10-CM | POA: Diagnosis not present

## 2012-01-30 DIAGNOSIS — R1032 Left lower quadrant pain: Secondary | ICD-10-CM | POA: Diagnosis not present

## 2012-03-09 DIAGNOSIS — E785 Hyperlipidemia, unspecified: Secondary | ICD-10-CM | POA: Diagnosis not present

## 2012-03-09 DIAGNOSIS — Z79899 Other long term (current) drug therapy: Secondary | ICD-10-CM | POA: Diagnosis not present

## 2012-03-09 DIAGNOSIS — E039 Hypothyroidism, unspecified: Secondary | ICD-10-CM | POA: Diagnosis not present

## 2012-03-09 DIAGNOSIS — I10 Essential (primary) hypertension: Secondary | ICD-10-CM | POA: Diagnosis not present

## 2012-03-22 DIAGNOSIS — K5730 Diverticulosis of large intestine without perforation or abscess without bleeding: Secondary | ICD-10-CM | POA: Diagnosis not present

## 2012-03-22 DIAGNOSIS — R195 Other fecal abnormalities: Secondary | ICD-10-CM | POA: Diagnosis not present

## 2012-04-13 DIAGNOSIS — J019 Acute sinusitis, unspecified: Secondary | ICD-10-CM | POA: Diagnosis not present

## 2012-07-09 DIAGNOSIS — K648 Other hemorrhoids: Secondary | ICD-10-CM | POA: Diagnosis not present

## 2012-07-09 DIAGNOSIS — R1032 Left lower quadrant pain: Secondary | ICD-10-CM | POA: Diagnosis not present

## 2012-08-02 DIAGNOSIS — D251 Intramural leiomyoma of uterus: Secondary | ICD-10-CM | POA: Diagnosis not present

## 2012-08-02 DIAGNOSIS — N838 Other noninflammatory disorders of ovary, fallopian tube and broad ligament: Secondary | ICD-10-CM | POA: Diagnosis not present

## 2012-08-04 ENCOUNTER — Other Ambulatory Visit: Payer: Self-pay | Admitting: Gynecologic Oncology

## 2012-08-04 ENCOUNTER — Telehealth: Payer: Self-pay | Admitting: *Deleted

## 2012-08-04 DIAGNOSIS — D4959 Neoplasm of unspecified behavior of other genitourinary organ: Secondary | ICD-10-CM

## 2012-08-04 NOTE — Telephone Encounter (Signed)
Called to notify patient of scheduled  U/S appointment. Pt agreed with appointment on August 11th @ 9:30 at [facility]. Pt was instructed to arrive at 9:15 to register and to have a full bladder. The [facility] scheduling Department number was given to the Ms. Goodell so that she may reschedule it she need to.

## 2012-08-19 ENCOUNTER — Other Ambulatory Visit: Payer: Self-pay

## 2012-08-19 DIAGNOSIS — Z1231 Encounter for screening mammogram for malignant neoplasm of breast: Secondary | ICD-10-CM

## 2012-08-23 ENCOUNTER — Encounter: Payer: Self-pay | Admitting: Gynecology

## 2012-08-23 ENCOUNTER — Ambulatory Visit (INDEPENDENT_AMBULATORY_CARE_PROVIDER_SITE_OTHER): Payer: Medicare Other | Admitting: Gynecology

## 2012-08-23 VITALS — BP 120/70 | Ht 63.0 in | Wt 165.0 lb

## 2012-08-23 DIAGNOSIS — K649 Unspecified hemorrhoids: Secondary | ICD-10-CM

## 2012-08-23 DIAGNOSIS — M949 Disorder of cartilage, unspecified: Secondary | ICD-10-CM | POA: Diagnosis not present

## 2012-08-23 DIAGNOSIS — D279 Benign neoplasm of unspecified ovary: Secondary | ICD-10-CM

## 2012-08-23 DIAGNOSIS — N816 Rectocele: Secondary | ICD-10-CM

## 2012-08-23 DIAGNOSIS — N952 Postmenopausal atrophic vaginitis: Secondary | ICD-10-CM | POA: Diagnosis not present

## 2012-08-23 DIAGNOSIS — M8580 Other specified disorders of bone density and structure, unspecified site: Secondary | ICD-10-CM

## 2012-08-23 MED ORDER — ESTRADIOL 0.1 MG/GM VA CREA: 1.0000 g | TOPICAL_CREAM | Freq: Every day | VAGINAL | Status: DC

## 2012-08-23 NOTE — Progress Notes (Signed)
Lynn Collins 12-Feb-1946 [id number]        67 y.o.  [id number] for followup exam.  Former patient of Dr. Eda Collins. Several issues noted below.  Past medical history,surgical history, medications, allergies, family history and social history were all reviewed and documented in the EPIC chart.  ROS:  Performed and pertinent positives and negatives are included in the history, assessment and plan .  Exam: Kim assistant Filed Vitals:   08/23/12 1018  BP: 120/70  Height: 5\' 3"  (1.6 m)  Weight: 165 lb (74.844 kg)   General appearance  Normal Skin grossly normal Head/Neck normal with no cervical or supraclavicular adenopathy thyroid normal Lungs  clear Cardiac RR, without RMG Abdominal  soft, nontender, without masses, organomegaly or hernia Breasts  examined lying and sitting without masses, retractions, discharge or axillary adenopathy. Pelvic  Ext/BUS/vagina with atrophic changes. First to second-degree rectocele. No significant cystocele. Uterus well supported.  Cervix  normal with atrophic changes  Uterus  anteverted, normal size, shape and contour, midline and mobile nontender   Adnexa  Without masses or tenderness    Anus and perineum  normal   Rectovaginal  normal sphincter tone without palpated masses or tenderness. Moderate hemorrhoids.   Assessment/Plan:  67 y.o. [id number] female for followup exam.   1. Questionable small left fibroma. Calcifications on the right ovary. Being followed by Dr. Duard Collins. Stable on serial ultrasounds with normal CA 125. Most recent ultrasound at [facility] June 2014 did not visualize the left ovary. She has a followup ultrasound scheduled in August with followup appointment with Dr. Duard Collins. To continue followup with her in reference to this. Her exam today is normal. 2. Rectocele. Stable and relatively asymptomatic to the patient. Occasional symptoms with constipation. Options include observation, pessary and surgery reviewed. Patient's not  interested in intervention but prefers observation. 3. Hemorrhoids. Has discussed options with Dr. Matthias Collins. Patient's not interested in doing anything with this at this time. 4. Atrophic vaginitis. Uses estrogen cream occasionally. I reviewed the issues of absorption with risks of stroke heart attack DVT breast cancer reviewed. Issues of endometrial stimulation also discussed. Of note her most recent endometrial echo is 1.8 mm on June ultrasound. Patient knows to report any vaginal bleeding. I reviewed the issues of use and whether it's doing her any good using it once or twice a month. I did refill her and she'll use this as she feels needed. 5. Osteopenia. DEXA 09/2011 with T score -1.1. FRAX 12%/0.6%. Plan repeat several years. Increase calcium vitamin D reviewed. 6. Pap smear 2012. No Pap smear done today. No history of abnormal Pap smears previously. Reviewed current screening guidelines and options to stop screening issues over the age of 48 discussed. Will review next year at 3 year interval. 7. Mammography 08/2011. Continue with annual mammography next month. SBE monthly reviewed. 8. Colonoscopy 2014. Repeat at their recommended interval. 9. Health maintenance. No blood work done as it is all done through her primary physician's office. Followup with Dr. Duard Collins otherwise one year, sooner as needed.   Lynn Lords MD, 10:51 AM 08/23/2012

## 2012-08-23 NOTE — Patient Instructions (Signed)
Followup for ultrasound and appointment with Dr. Duard Brady as scheduled. Followup here in 1 year.

## 2012-08-24 LAB — URINALYSIS W MICROSCOPIC + REFLEX CULTURE
Bacteria, UA: NONE SEEN
Casts: NONE SEEN
Hgb urine dipstick: NEGATIVE
Ketones, ur: NEGATIVE mg/dL
Nitrite: NEGATIVE
pH: 7 (ref 5.0–8.0)

## 2012-09-13 ENCOUNTER — Ambulatory Visit
Admission: RE | Admit: 2012-09-13 | Discharge: 2012-09-13 | Disposition: A | Discharge status: Home / Self Care | Payer: Medicare Other | Source: Ambulatory Visit

## 2012-09-13 DIAGNOSIS — Z1231 Encounter for screening mammogram for malignant neoplasm of breast: Secondary | ICD-10-CM | POA: Diagnosis not present

## 2012-09-20 DIAGNOSIS — R1032 Left lower quadrant pain: Secondary | ICD-10-CM | POA: Diagnosis not present

## 2012-09-27 DIAGNOSIS — E039 Hypothyroidism, unspecified: Secondary | ICD-10-CM | POA: Diagnosis not present

## 2012-10-04 ENCOUNTER — Other Ambulatory Visit: Payer: Self-pay | Admitting: Gynecologic Oncology

## 2012-10-04 ENCOUNTER — Ambulatory Visit (HOSPITAL_COMMUNITY)
Admission: RE | Admit: 2012-10-04 | Discharge: 2012-10-04 | Disposition: A | Discharge status: Home / Self Care | Payer: Medicare Other | Source: Ambulatory Visit | Attending: Gynecologic Oncology | Admitting: Gynecologic Oncology

## 2012-10-04 DIAGNOSIS — D251 Intramural leiomyoma of uterus: Secondary | ICD-10-CM | POA: Insufficient documentation

## 2012-10-04 DIAGNOSIS — D4959 Neoplasm of unspecified behavior of other genitourinary organ: Secondary | ICD-10-CM

## 2012-10-04 DIAGNOSIS — D259 Leiomyoma of uterus, unspecified: Secondary | ICD-10-CM | POA: Diagnosis not present

## 2012-10-06 ENCOUNTER — Encounter: Payer: Self-pay | Admitting: Gynecology

## 2012-10-07 ENCOUNTER — Telehealth: Payer: Self-pay | Admitting: Gynecologic Oncology

## 2012-10-07 NOTE — Telephone Encounter (Signed)
Patient called requesting ultrasound results.  Informed of results and Dr. Denman George recommendations for follow up with her gynecologist and GYN Onc as needed.  Verbalizing understanding.  Instructed to call for any needs.

## 2012-10-13 ENCOUNTER — Ambulatory Visit (INDEPENDENT_AMBULATORY_CARE_PROVIDER_SITE_OTHER): Payer: Medicare Other | Admitting: Gynecology

## 2012-10-13 ENCOUNTER — Encounter: Payer: Self-pay | Admitting: Gynecology

## 2012-10-13 ENCOUNTER — Telehealth: Payer: Self-pay | Admitting: *Deleted

## 2012-10-13 DIAGNOSIS — N63 Unspecified lump in unspecified breast: Secondary | ICD-10-CM

## 2012-10-13 DIAGNOSIS — N644 Mastodynia: Secondary | ICD-10-CM | POA: Diagnosis not present

## 2012-10-13 NOTE — Telephone Encounter (Signed)
Orders placed breast center will call patient.

## 2012-10-13 NOTE — Progress Notes (Signed)
Patient presents noticing a questionable lump in her left tail of Spence region. Most recently noticed. Had mammogram 08/2012 which was normal. No history of this before. Also had recent ultrasound in followup of her ovaries where there was calcification in the right ovary questionable mass on the left. Right ovary was normal without significant findings in left ovary was not visualized.  Exam was [employer] Both breast examined lying and sitting without masses retractions discharge adenopathy. The area the patient is pointing to see left tail of Spence but there is no clear masses or other abnormalities.  Assessment and plan: Recently appreciated questionable mass left tail of Spence by patient. Physician exam is normal. Recent normal mammogram. Recommend ultrasound diagnostic mammogram of this area. Assuming negative then plan expectant management with SBE. As long as this area remains unchanged her results and will follow. She knows even with negative testing if it changes she needs to followup with me.

## 2012-10-13 NOTE — Patient Instructions (Signed)
Office will contact you to arrange for ultrasound and mammogram

## 2012-10-13 NOTE — Telephone Encounter (Signed)
Message copied by Aura Camps on Wed Oct 13, 2012  4:31 PM ------      Message from: Dara Lords      Created: Wed Oct 13, 2012  2:50 PM       Schedule ultrasound and diagnostic mammogram reference left tail of Spence nodularity newly found by patient. ------

## 2012-10-15 NOTE — Telephone Encounter (Signed)
appt 11/05/12 @9 :20 am

## 2012-10-19 DIAGNOSIS — I10 Essential (primary) hypertension: Secondary | ICD-10-CM | POA: Diagnosis not present

## 2012-10-19 DIAGNOSIS — E782 Mixed hyperlipidemia: Secondary | ICD-10-CM | POA: Diagnosis not present

## 2012-10-19 DIAGNOSIS — Z23 Encounter for immunization: Secondary | ICD-10-CM | POA: Diagnosis not present

## 2012-10-19 DIAGNOSIS — Z0000 Encounter for general adult medical examination without abnormal findings: Secondary | ICD-10-CM | POA: Diagnosis not present

## 2012-11-05 ENCOUNTER — Ambulatory Visit
Admission: RE | Admit: 2012-11-05 | Discharge: 2012-11-05 | Disposition: A | Discharge status: Home / Self Care | Payer: Commercial Indemnity | Source: Ambulatory Visit | Attending: Gynecology | Admitting: Gynecology

## 2012-11-05 DIAGNOSIS — R922 Inconclusive mammogram: Secondary | ICD-10-CM | POA: Diagnosis not present

## 2012-11-05 DIAGNOSIS — N63 Unspecified lump in unspecified breast: Secondary | ICD-10-CM

## 2012-12-30 ENCOUNTER — Other Ambulatory Visit: Payer: Self-pay

## 2013-02-27 DIAGNOSIS — J019 Acute sinusitis, unspecified: Secondary | ICD-10-CM | POA: Diagnosis not present

## 2013-05-09 DIAGNOSIS — K5790 Diverticulosis of intestine, part unspecified, without perforation or abscess without bleeding: Secondary | ICD-10-CM | POA: Insufficient documentation

## 2013-05-09 DIAGNOSIS — K5730 Diverticulosis of large intestine without perforation or abscess without bleeding: Secondary | ICD-10-CM | POA: Diagnosis not present

## 2013-05-09 DIAGNOSIS — E039 Hypothyroidism, unspecified: Secondary | ICD-10-CM | POA: Diagnosis not present

## 2013-05-09 DIAGNOSIS — Z79899 Other long term (current) drug therapy: Secondary | ICD-10-CM | POA: Diagnosis not present

## 2013-05-09 DIAGNOSIS — I10 Essential (primary) hypertension: Secondary | ICD-10-CM | POA: Diagnosis not present

## 2013-05-09 DIAGNOSIS — E782 Mixed hyperlipidemia: Secondary | ICD-10-CM | POA: Diagnosis not present

## 2013-06-10 DIAGNOSIS — R51 Headache: Secondary | ICD-10-CM | POA: Diagnosis not present

## 2013-06-10 DIAGNOSIS — I10 Essential (primary) hypertension: Secondary | ICD-10-CM | POA: Diagnosis not present

## 2013-06-13 ENCOUNTER — Other Ambulatory Visit: Payer: Self-pay | Admitting: Family Medicine

## 2013-06-13 DIAGNOSIS — R51 Headache: Principal | ICD-10-CM

## 2013-06-13 DIAGNOSIS — R519 Headache, unspecified: Secondary | ICD-10-CM

## 2013-06-17 ENCOUNTER — Ambulatory Visit
Admission: RE | Admit: 2013-06-17 | Discharge: 2013-06-17 | Disposition: A | Discharge status: Home / Self Care | Payer: Medicare Other | Source: Ambulatory Visit | Attending: Family Medicine | Admitting: Family Medicine

## 2013-06-17 DIAGNOSIS — R42 Dizziness and giddiness: Secondary | ICD-10-CM | POA: Diagnosis not present

## 2013-06-17 DIAGNOSIS — R51 Headache: Secondary | ICD-10-CM | POA: Diagnosis not present

## 2013-06-17 DIAGNOSIS — R519 Headache, unspecified: Secondary | ICD-10-CM

## 2013-06-23 DIAGNOSIS — I10 Essential (primary) hypertension: Secondary | ICD-10-CM | POA: Diagnosis not present

## 2013-06-23 DIAGNOSIS — E041 Nontoxic single thyroid nodule: Secondary | ICD-10-CM | POA: Diagnosis not present

## 2013-06-24 ENCOUNTER — Other Ambulatory Visit: Payer: Self-pay | Admitting: Family Medicine

## 2013-06-24 DIAGNOSIS — E041 Nontoxic single thyroid nodule: Secondary | ICD-10-CM

## 2013-06-29 ENCOUNTER — Ambulatory Visit
Admission: RE | Admit: 2013-06-29 | Discharge: 2013-06-29 | Disposition: A | Discharge status: Home / Self Care | Payer: Medicare Other | Source: Ambulatory Visit | Attending: Family Medicine | Admitting: Family Medicine

## 2013-06-29 DIAGNOSIS — E041 Nontoxic single thyroid nodule: Secondary | ICD-10-CM

## 2013-06-29 DIAGNOSIS — E042 Nontoxic multinodular goiter: Secondary | ICD-10-CM | POA: Diagnosis not present

## 2013-07-01 ENCOUNTER — Other Ambulatory Visit: Payer: Self-pay | Admitting: Family Medicine

## 2013-07-01 DIAGNOSIS — E041 Nontoxic single thyroid nodule: Secondary | ICD-10-CM

## 2013-07-01 DIAGNOSIS — E042 Nontoxic multinodular goiter: Secondary | ICD-10-CM

## 2013-07-20 ENCOUNTER — Other Ambulatory Visit (HOSPITAL_COMMUNITY)
Admission: RE | Admit: 2013-07-20 | Discharge: 2013-07-20 | Disposition: A | Discharge status: Home / Self Care | Payer: Medicare Other | Source: Ambulatory Visit | Attending: Diagnostic Radiology | Admitting: Diagnostic Radiology

## 2013-07-20 ENCOUNTER — Ambulatory Visit
Admission: RE | Admit: 2013-07-20 | Discharge: 2013-07-20 | Disposition: A | Discharge status: Home / Self Care | Payer: Medicare Other | Source: Ambulatory Visit | Attending: Family Medicine | Admitting: Family Medicine

## 2013-07-20 DIAGNOSIS — E042 Nontoxic multinodular goiter: Secondary | ICD-10-CM

## 2013-07-20 DIAGNOSIS — D449 Neoplasm of uncertain behavior of unspecified endocrine gland: Secondary | ICD-10-CM | POA: Diagnosis not present

## 2013-07-20 DIAGNOSIS — E041 Nontoxic single thyroid nodule: Secondary | ICD-10-CM

## 2013-08-10 DIAGNOSIS — E041 Nontoxic single thyroid nodule: Secondary | ICD-10-CM | POA: Diagnosis not present

## 2013-08-24 ENCOUNTER — Encounter: Payer: Medicare Other | Admitting: Gynecology

## 2013-08-25 ENCOUNTER — Other Ambulatory Visit (HOSPITAL_COMMUNITY)
Admission: RE | Admit: 2013-08-25 | Discharge: 2013-08-25 | Disposition: A | Discharge status: Home / Self Care | Payer: Medicare Other | Source: Ambulatory Visit | Attending: Gynecology | Admitting: Gynecology

## 2013-08-25 ENCOUNTER — Ambulatory Visit (INDEPENDENT_AMBULATORY_CARE_PROVIDER_SITE_OTHER): Payer: Medicare Other | Admitting: Gynecology

## 2013-08-25 ENCOUNTER — Encounter: Payer: Self-pay | Admitting: Gynecology

## 2013-08-25 ENCOUNTER — Other Ambulatory Visit: Payer: Self-pay | Admitting: *Deleted

## 2013-08-25 VITALS — BP 120/78 | Ht 64.0 in | Wt 166.0 lb

## 2013-08-25 DIAGNOSIS — N816 Rectocele: Secondary | ICD-10-CM | POA: Diagnosis not present

## 2013-08-25 DIAGNOSIS — M949 Disorder of cartilage, unspecified: Secondary | ICD-10-CM

## 2013-08-25 DIAGNOSIS — N952 Postmenopausal atrophic vaginitis: Secondary | ICD-10-CM | POA: Diagnosis not present

## 2013-08-25 DIAGNOSIS — Z124 Encounter for screening for malignant neoplasm of cervix: Secondary | ICD-10-CM

## 2013-08-25 DIAGNOSIS — M8580 Other specified disorders of bone density and structure, unspecified site: Secondary | ICD-10-CM

## 2013-08-25 DIAGNOSIS — N839 Noninflammatory disorder of ovary, fallopian tube and broad ligament, unspecified: Secondary | ICD-10-CM

## 2013-08-25 DIAGNOSIS — N838 Other noninflammatory disorders of ovary, fallopian tube and broad ligament: Secondary | ICD-10-CM

## 2013-08-25 DIAGNOSIS — M899 Disorder of bone, unspecified: Secondary | ICD-10-CM

## 2013-08-25 MED ORDER — ESTRADIOL 0.1 MG/GM VA CREA: 1.0000 g | TOPICAL_CREAM | Freq: Every day | VAGINAL | Status: DC

## 2013-08-25 NOTE — Patient Instructions (Signed)
Make an appointment to see your urologist Dr. Janice Norrie in reference to the questionable cyst within the bladder seen on ultrasound. It is important that you do this. Followup for mammogram this summer as you were due for your annual mammogram. Call Dr. 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 Followup in one year for annual exam, sooner if any issues.  You may obtain a copy of any labs that were done today by logging onto MyChart as outlined in the instructions provided with your AVS (after visit summary). The office will not call with normal lab results but certainly if there are any significant abnormalities then we will contact you.   Health Maintenance, Female A healthy lifestyle and preventative care can promote health and wellness.  Maintain regular health, dental, and eye exams.  Eat a healthy diet. Foods like vegetables, fruits, whole grains, low-fat dairy products, and lean protein foods contain the nutrients you need without too many calories. Decrease your intake of foods high in solid fats, added sugars, and salt. Get information about a proper diet from your caregiver, if necessary.  Regular physical exercise is one of the most important things you can do for your health. Most adults should get at least 150 minutes of moderate-intensity exercise (any activity that increases your heart rate and causes you to sweat) each week. In addition, most adults need muscle-strengthening exercises on 2 or more days a week.   Maintain a healthy weight. The body mass index (BMI) is a screening tool to identify possible weight problems. It provides an estimate of body fat based on height and weight. Your caregiver can help determine your BMI, and can help you achieve or maintain a healthy weight. For adults 20 years and older:  A BMI below 18.5 is considered underweight.  A BMI of 18.5 to 24.9 is normal.  A BMI of 25 to 29.9 is considered  overweight.  A BMI of 30 and above is considered obese.  Maintain normal blood lipids and cholesterol by exercising and minimizing your intake of saturated fat. Eat a balanced diet with plenty of fruits and vegetables. Blood tests for lipids and cholesterol should begin at age 45 and be repeated every 5 years. If your lipid or cholesterol levels are high, you are over 50, or you are a high risk for heart disease, you may need your cholesterol levels checked more frequently.Ongoing high lipid and cholesterol levels should be treated with medicines if diet and exercise are not effective.  If you smoke, find out from your caregiver how to quit. If you do not use tobacco, do not start.  Lung cancer screening is recommended for adults aged 42 80 years who are at high risk for developing lung cancer because of a history of smoking. Yearly low-dose computed tomography (CT) is recommended for people who have at least a 30-pack-year history of smoking and are a current smoker or have quit within the past 15 years. A pack year of smoking is smoking an average of 1 pack of cigarettes a day for 1 year (for example: 1 pack a day for 30 years or 2 packs a day for 15 years). Yearly screening should continue until the smoker has stopped smoking for at least 15 years. Yearly screening should also be stopped for people who develop a health problem that would prevent them from having lung cancer treatment.  If you are pregnant, do not drink alcohol. If you  are breastfeeding, be very cautious about drinking alcohol. If you are not pregnant and choose to drink alcohol, do not exceed 1 drink per day. One drink is considered to be 12 ounces (355 mL) of beer, 5 ounces (148 mL) of wine, or 1.5 ounces (44 mL) of liquor.  Avoid use of street drugs. Do not share needles with anyone. Ask for help if you need support or instructions about stopping the use of drugs.  High blood pressure causes heart disease and increases the risk  of stroke. Blood pressure should be checked at least every 1 to 2 years. Ongoing high blood pressure should be treated with medicines, if weight loss and exercise are not effective.  If you are 31 to 69 years old, ask your caregiver if you should take aspirin to prevent strokes.  Diabetes screening involves taking a blood sample to check your fasting blood sugar level. This should be done once every 3 years, after age 4, if you are within normal weight and without risk factors for diabetes. Testing should be considered at a younger age or be carried out more frequently if you are overweight and have at least 1 risk factor for diabetes.  Breast cancer screening is essential preventative care for women. You should practice "breast self-awareness." This means understanding the normal appearance and feel of your breasts and may include breast self-examination. Any changes detected, no matter how small, should be reported to a caregiver. Women in their 45s and 30s should have a clinical breast exam (CBE) by a caregiver as part of a regular health exam every 1 to 3 years. After age 22, women should have a CBE every year. Starting at age 34, women should consider having a mammogram (breast X-ray) every year. Women who have a family history of breast cancer should talk to their caregiver about genetic screening. Women at a high risk of breast cancer should talk to their caregiver about having an MRI and a mammogram every year.  Breast cancer gene (BRCA)-related cancer risk assessment is recommended for women who have family members with BRCA-related cancers. BRCA-related cancers include breast, ovarian, tubal, and peritoneal cancers. Having family members with these cancers may be associated with an increased risk for harmful changes (mutations) in the breast cancer genes BRCA1 and BRCA2. Results of the assessment will determine the need for genetic counseling and BRCA1 and BRCA2 testing.  The Pap test is a  screening test for cervical cancer. Women should have a Pap test starting at age 17. Between ages 97 and 52, Pap tests should be repeated every 2 years. Beginning at age 43, you should have a Pap test every 3 years as long as the past 3 Pap tests have been normal. If you had a hysterectomy for a problem that was not cancer or a condition that could lead to cancer, then you no longer need Pap tests. If you are between ages 61 and 50, and you have had normal Pap tests going back 10 years, you no longer need Pap tests. If you have had past treatment for cervical cancer or a condition that could lead to cancer, you need Pap tests and screening for cancer for at least 20 years after your treatment. If Pap tests have been discontinued, risk factors (such as a new sexual partner) need to be reassessed to determine if screening should be resumed. Some women have medical problems that increase the chance of getting cervical cancer. In these cases, your caregiver may recommend more frequent  screening and Pap tests.  The human papillomavirus (HPV) test is an additional test that may be used for cervical cancer screening. The HPV test looks for the virus that can cause the cell changes on the cervix. The cells collected during the Pap test can be tested for HPV. The HPV test could be used to screen women aged 29 years and older, and should be used in women of any age who have unclear Pap test results. After the age of 46, women should have HPV testing at the same frequency as a Pap test.  Colorectal cancer can be detected and often prevented. Most routine colorectal cancer screening begins at the age of 54 and continues through age 34. However, your caregiver may recommend screening at an earlier age if you have risk factors for colon cancer. On a yearly basis, your caregiver may provide home test kits to check for hidden blood in the stool. Use of a small camera at the end of a tube, to directly examine the colon  (sigmoidoscopy or colonoscopy), can detect the earliest forms of colorectal cancer. Talk to your caregiver about this at age 69, when routine screening begins. Direct examination of the colon should be repeated every 5 to 10 years through age 14, unless early forms of pre-cancerous polyps or small growths are found.  Hepatitis C blood testing is recommended for all people born from [birth year] through 1966 and any individual with known risks for hepatitis C.  Practice safe sex. Use condoms and avoid high-risk sexual practices to reduce the spread of sexually transmitted infections (STIs). Sexually active women aged 38 and younger should be checked for Chlamydia, which is a common sexually transmitted infection. Older women with new or multiple partners should also be tested for Chlamydia. Testing for other STIs is recommended if you are sexually active and at increased risk.  Osteoporosis is a disease in which the bones lose minerals and strength with aging. This can result in serious bone fractures. The risk of osteoporosis can be identified using a bone density scan. Women ages 33 and over and women at risk for fractures or osteoporosis should discuss screening with their caregivers. Ask your caregiver whether you should be taking a calcium supplement or vitamin D to reduce the rate of osteoporosis.  Menopause can be associated with physical symptoms and risks. Hormone replacement therapy is available to decrease symptoms and risks. You should talk to your caregiver about whether hormone replacement therapy is right for you.  Use sunscreen. Apply sunscreen liberally and repeatedly throughout the day. You should seek shade when your shadow is shorter than you. Protect yourself by wearing long sleeves, pants, a wide-brimmed hat, and sunglasses year round, whenever you are outdoors.  Notify your caregiver of new moles or changes in moles, especially if there is a change in shape or color. Also notify your  caregiver if a mole is larger than the size of a pencil eraser.  Stay current with your immunizations. Document Released: 08/26/2010 Document Revised: 06/07/2012 Document Reviewed: 08/26/2010 [facility] Patient Information 2014 [place].

## 2013-08-25 NOTE — Progress Notes (Signed)
Lynn Collins March 24, 1945 [id number]        67 y.o.  [id number] for followup exam. Several issues noted below.  Past medical history,surgical history, problem list, medications, allergies, family history and social history were all reviewed and documented as reviewed in the EPIC chart.  ROS:  12 system ROS performed with pertinent positives and negatives included in the history, assessment and plan.   Additional significant findings :  None   Exam: Lynn Collins Vitals:   08/25/13 0756  BP: 120/78  Height: 5\' 4"  (1.626 m)  Weight: 166 lb (75.297 kg)   General appearance:  Normal affect, orientation and appearance. Skin: Grossly normal HEENT: Without gross lesions.  No cervical or supraclavicular adenopathy. Thyroid normal.  Lungs:  Clear without wheezing, rales or rhonchi Cardiac: RR, without RMG Abdominal:  Soft, nontender, without masses, guarding, rebound, organomegaly or hernia Breasts:  Examined lying and sitting without masses, retractions, discharge or axillary adenopathy. Pelvic:  Ext/BUS/vagina with atrophic changes. First to second degree rectocele.  Cervix with atrophic changes. Pap done  Uterus anteverted, normal size, shape and contour, midline and mobile nontender   Adnexa  Without masses or tenderness    Anus and perineum  Normal with external hemorrhoids  Rectovaginal  Normal sphincter tone without palpated masses or tenderness.    Assessment/Plan:  68 y.o. [id number] female for followup exam.   1. Postmenopausal/atrophic genital changes. Patient continues to use Estrace vaginal cream intermittently for vaginal dryness. Does not use it consistently but finds relief with intermittent use. We have reviewed the risks to include absorption with systemic effects such as thrombosis, breast cancer and endometrial stimulation. Had ultrasound a year ago 09/2012 through Dr. Preston Collins office which showed an endometrial echo of 1 mm. Refill for Estrace  provided. 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 Ned Collins. Last ultrasound August 2014 showed no evidence of the fibroma with both ovaries appearing normal. Small uterine myoma noted. I asked her to call Dr. Preston Collins office to check if she needs to be seen again and she agrees to call. 3. Questionable bladder cyst. In review of her ultrasounds she did have an ultrasound 12/2011 were a small possible cyst in her bladder was seen with post void residual. Dr. Cherylann Collins had recommended followup with urologist Dr. Janice Collins who she normally sees. The patient remembers being told to do so but she never followed up with this. I strongly urged her to call Dr. Sammie Collins office and schedule an appointment to see what further evaluation he recommends. The patient agrees to do so and is importance to do so. 4. Rectocele. Patient has a first to second degree rectocele which is stable over serial exams. She is asymptomatic without significant pressure, discomfort or stool trapping. Will continue to monitor. 5. Osteopenia. DEXA 09/2011 with T score -1.1 FRAX 12%/0.6%. Increase calcium vitamin D reviewed. Plan repeat DEXA at age 32. 36. Pap smear 2012. Pap done today. No history of abnormal Pap smears previously. Reviewed current screening guidelines options to stop screening altogether she is over the age of 47 versus less frequent screening intervals reviewed. Will readdress on an annual basis. 7. Colonoscopy 2014. Repeat at their recommended interval. 8. Mammography 08/2012. Repeat this month or next as she is due. SBE monthly reviewed. 9. Health maintenance. Actively been followed by her primary physician. No blood work done as this is done through his office. Followup one year, sooner as needed. Patient to call if she has any issues contacting Dr.  Nancy Collins or Dr. Janice Collins.   Note: This document was prepared with digital dictation and possible smart phrase technology. Any transcriptional errors that result from  this process are unintentional.   Lynn Auerbach MD, 8:26 AM 08/25/2013

## 2013-08-25 NOTE — Addendum Note (Signed)
Addended by: Nelva Nay on: 08/25/2013 09:10 AM   Modules accepted: Orders

## 2013-08-26 LAB — URINALYSIS W MICROSCOPIC + REFLEX CULTURE
BACTERIA UA: NONE SEEN
BILIRUBIN URINE: NEGATIVE
CRYSTALS: NONE SEEN
Casts: NONE SEEN
GLUCOSE, UA: NEGATIVE mg/dL
Hgb urine dipstick: NEGATIVE
KETONES UR: NEGATIVE mg/dL
Leukocytes, UA: NEGATIVE
Nitrite: NEGATIVE
Protein, ur: NEGATIVE mg/dL
SPECIFIC GRAVITY, URINE: 1.008 (ref 1.005–1.030)
SQUAMOUS EPITHELIAL / LPF: NONE SEEN
Urobilinogen, UA: 0.2 mg/dL (ref 0.0–1.0)
pH: 7.5 (ref 5.0–8.0)

## 2013-08-29 LAB — CYTOLOGY - PAP

## 2013-09-02 DIAGNOSIS — K5730 Diverticulosis of large intestine without perforation or abscess without bleeding: Secondary | ICD-10-CM | POA: Diagnosis not present

## 2013-09-02 DIAGNOSIS — M899 Disorder of bone, unspecified: Secondary | ICD-10-CM | POA: Diagnosis not present

## 2013-09-02 DIAGNOSIS — Z9089 Acquired absence of other organs: Secondary | ICD-10-CM | POA: Diagnosis not present

## 2013-09-02 DIAGNOSIS — E782 Mixed hyperlipidemia: Secondary | ICD-10-CM | POA: Diagnosis not present

## 2013-09-02 DIAGNOSIS — E041 Nontoxic single thyroid nodule: Secondary | ICD-10-CM | POA: Diagnosis not present

## 2013-09-02 DIAGNOSIS — I10 Essential (primary) hypertension: Secondary | ICD-10-CM | POA: Diagnosis not present

## 2013-09-02 DIAGNOSIS — Z882 Allergy status to sulfonamides status: Secondary | ICD-10-CM | POA: Diagnosis not present

## 2013-09-02 DIAGNOSIS — E039 Hypothyroidism, unspecified: Secondary | ICD-10-CM | POA: Diagnosis not present

## 2013-09-02 DIAGNOSIS — Z7982 Long term (current) use of aspirin: Secondary | ICD-10-CM | POA: Diagnosis not present

## 2013-09-02 DIAGNOSIS — Z9851 Tubal ligation status: Secondary | ICD-10-CM | POA: Diagnosis not present

## 2013-09-02 DIAGNOSIS — Z808 Family history of malignant neoplasm of other organs or systems: Secondary | ICD-10-CM | POA: Diagnosis not present

## 2013-09-02 DIAGNOSIS — Z79899 Other long term (current) drug therapy: Secondary | ICD-10-CM | POA: Diagnosis not present

## 2013-09-02 DIAGNOSIS — E042 Nontoxic multinodular goiter: Secondary | ICD-10-CM | POA: Insufficient documentation

## 2013-09-02 DIAGNOSIS — M949 Disorder of cartilage, unspecified: Secondary | ICD-10-CM | POA: Diagnosis not present

## 2013-09-26 DIAGNOSIS — N390 Urinary tract infection, site not specified: Secondary | ICD-10-CM | POA: Diagnosis not present

## 2013-09-26 DIAGNOSIS — R350 Frequency of micturition: Secondary | ICD-10-CM | POA: Diagnosis not present

## 2013-10-14 ENCOUNTER — Other Ambulatory Visit: Payer: Self-pay

## 2013-10-14 DIAGNOSIS — N200 Calculus of kidney: Secondary | ICD-10-CM | POA: Diagnosis not present

## 2013-10-14 DIAGNOSIS — Z1231 Encounter for screening mammogram for malignant neoplasm of breast: Secondary | ICD-10-CM

## 2013-10-25 DIAGNOSIS — Z0000 Encounter for general adult medical examination without abnormal findings: Secondary | ICD-10-CM | POA: Diagnosis not present

## 2013-10-25 DIAGNOSIS — I10 Essential (primary) hypertension: Secondary | ICD-10-CM | POA: Diagnosis not present

## 2013-10-25 DIAGNOSIS — E782 Mixed hyperlipidemia: Secondary | ICD-10-CM | POA: Diagnosis not present

## 2013-10-25 DIAGNOSIS — M8580 Other specified disorders of bone density and structure, unspecified site: Secondary | ICD-10-CM

## 2013-10-25 DIAGNOSIS — Z23 Encounter for immunization: Secondary | ICD-10-CM | POA: Diagnosis not present

## 2013-10-25 DIAGNOSIS — E039 Hypothyroidism, unspecified: Secondary | ICD-10-CM | POA: Diagnosis not present

## 2013-10-25 HISTORY — DX: Other specified disorders of bone density and structure, unspecified site: M85.80

## 2013-10-26 ENCOUNTER — Other Ambulatory Visit: Payer: Self-pay | Admitting: Family Medicine

## 2013-10-26 DIAGNOSIS — E039 Hypothyroidism, unspecified: Secondary | ICD-10-CM

## 2013-11-07 DIAGNOSIS — R1904 Left lower quadrant abdominal swelling, mass and lump: Secondary | ICD-10-CM | POA: Diagnosis not present

## 2013-11-07 DIAGNOSIS — Z01419 Encounter for gynecological examination (general) (routine) without abnormal findings: Secondary | ICD-10-CM | POA: Diagnosis not present

## 2013-11-10 ENCOUNTER — Telehealth: Payer: Self-pay | Admitting: Gynecology

## 2013-11-10 ENCOUNTER — Ambulatory Visit
Admission: RE | Admit: 2013-11-10 | Discharge: 2013-11-10 | Disposition: A | Discharge status: Home / Self Care | Payer: Medicare Other | Source: Ambulatory Visit

## 2013-11-10 ENCOUNTER — Ambulatory Visit
Admission: RE | Admit: 2013-11-10 | Discharge: 2013-11-10 | Disposition: A | Discharge status: Home / Self Care | Payer: Medicare Other | Source: Ambulatory Visit | Attending: Gynecology | Admitting: Gynecology

## 2013-11-10 ENCOUNTER — Encounter: Payer: Self-pay | Admitting: Gynecology

## 2013-11-10 DIAGNOSIS — Z1231 Encounter for screening mammogram for malignant neoplasm of breast: Secondary | ICD-10-CM | POA: Diagnosis not present

## 2013-11-10 DIAGNOSIS — M898X9 Other specified disorders of bone, unspecified site: Secondary | ICD-10-CM

## 2013-11-10 DIAGNOSIS — M949 Disorder of cartilage, unspecified: Secondary | ICD-10-CM | POA: Diagnosis not present

## 2013-11-10 DIAGNOSIS — M8580 Other specified disorders of bone density and structure, unspecified site: Secondary | ICD-10-CM

## 2013-11-10 DIAGNOSIS — M899 Disorder of bone, unspecified: Secondary | ICD-10-CM | POA: Diagnosis not present

## 2013-11-10 DIAGNOSIS — Z780 Asymptomatic menopausal state: Secondary | ICD-10-CM | POA: Diagnosis not present

## 2013-11-10 NOTE — Telephone Encounter (Signed)
Tell patient that her bone density shows her spine is stable but she did lose some bone in her hip. Not enough that we want to treat her but I would recommend walking and other weightbearing exercise and checking a vitamin D level. Make sure she is getting adequate calcium which should be roughly 1200 mg total dietary calcium daily

## 2013-11-11 DIAGNOSIS — N200 Calculus of kidney: Secondary | ICD-10-CM | POA: Diagnosis not present

## 2013-11-14 NOTE — Telephone Encounter (Signed)
Pt informed with the below note, pt said she is walking more now and working out at [employer] and no longer has pain in hip. Pt said she is going to increase her calcium daily as well.

## 2013-12-01 ENCOUNTER — Ambulatory Visit
Admission: RE | Admit: 2013-12-01 | Discharge: 2013-12-01 | Disposition: A | Discharge status: Home / Self Care | Payer: Medicare Other | Source: Ambulatory Visit | Attending: Family Medicine | Admitting: Family Medicine

## 2013-12-01 DIAGNOSIS — E042 Nontoxic multinodular goiter: Secondary | ICD-10-CM | POA: Diagnosis not present

## 2013-12-01 DIAGNOSIS — E039 Hypothyroidism, unspecified: Secondary | ICD-10-CM

## 2013-12-19 DIAGNOSIS — E042 Nontoxic multinodular goiter: Secondary | ICD-10-CM | POA: Diagnosis not present

## 2013-12-19 DIAGNOSIS — E039 Hypothyroidism, unspecified: Secondary | ICD-10-CM | POA: Diagnosis not present

## 2013-12-19 DIAGNOSIS — D251 Intramural leiomyoma of uterus: Secondary | ICD-10-CM | POA: Diagnosis not present

## 2013-12-26 ENCOUNTER — Encounter: Payer: Self-pay | Admitting: Gynecology

## 2014-03-08 DIAGNOSIS — H6980 Other specified disorders of Eustachian tube, unspecified ear: Secondary | ICD-10-CM | POA: Diagnosis not present

## 2014-03-08 DIAGNOSIS — J329 Chronic sinusitis, unspecified: Secondary | ICD-10-CM | POA: Diagnosis not present

## 2014-05-02 DIAGNOSIS — E039 Hypothyroidism, unspecified: Secondary | ICD-10-CM | POA: Diagnosis not present

## 2014-06-06 DIAGNOSIS — I700 Atherosclerosis of aorta: Secondary | ICD-10-CM | POA: Diagnosis not present

## 2014-06-06 DIAGNOSIS — N132 Hydronephrosis with renal and ureteral calculous obstruction: Secondary | ICD-10-CM | POA: Diagnosis not present

## 2014-06-06 DIAGNOSIS — R1110 Vomiting, unspecified: Secondary | ICD-10-CM | POA: Diagnosis not present

## 2014-06-06 DIAGNOSIS — N23 Unspecified renal colic: Secondary | ICD-10-CM | POA: Diagnosis not present

## 2014-06-06 DIAGNOSIS — E039 Hypothyroidism, unspecified: Secondary | ICD-10-CM | POA: Diagnosis not present

## 2014-06-06 DIAGNOSIS — R109 Unspecified abdominal pain: Secondary | ICD-10-CM | POA: Diagnosis not present

## 2014-06-06 DIAGNOSIS — I10 Essential (primary) hypertension: Secondary | ICD-10-CM | POA: Diagnosis not present

## 2014-06-06 DIAGNOSIS — K5790 Diverticulosis of intestine, part unspecified, without perforation or abscess without bleeding: Secondary | ICD-10-CM | POA: Diagnosis not present

## 2014-06-09 DIAGNOSIS — N201 Calculus of ureter: Secondary | ICD-10-CM | POA: Diagnosis not present

## 2014-06-19 DIAGNOSIS — N201 Calculus of ureter: Secondary | ICD-10-CM | POA: Diagnosis not present

## 2014-06-23 DIAGNOSIS — E042 Nontoxic multinodular goiter: Secondary | ICD-10-CM | POA: Diagnosis not present

## 2014-06-23 DIAGNOSIS — E039 Hypothyroidism, unspecified: Secondary | ICD-10-CM | POA: Diagnosis not present

## 2014-06-23 DIAGNOSIS — N200 Calculus of kidney: Secondary | ICD-10-CM | POA: Diagnosis not present

## 2014-07-05 DIAGNOSIS — N201 Calculus of ureter: Secondary | ICD-10-CM | POA: Diagnosis not present

## 2014-07-21 ENCOUNTER — Other Ambulatory Visit: Payer: Self-pay | Admitting: Internal Medicine

## 2014-07-21 DIAGNOSIS — E042 Nontoxic multinodular goiter: Secondary | ICD-10-CM

## 2014-07-28 ENCOUNTER — Ambulatory Visit
Admission: RE | Admit: 2014-07-28 | Discharge: 2014-07-28 | Disposition: A | Discharge status: Home / Self Care | Payer: Medicare Other | Source: Ambulatory Visit | Attending: Internal Medicine | Admitting: Internal Medicine

## 2014-07-28 DIAGNOSIS — E042 Nontoxic multinodular goiter: Secondary | ICD-10-CM

## 2014-08-08 ENCOUNTER — Ambulatory Visit: Payer: Medicare Other | Admitting: Gynecology

## 2014-08-09 DIAGNOSIS — R350 Frequency of micturition: Secondary | ICD-10-CM | POA: Diagnosis not present

## 2014-08-09 DIAGNOSIS — N201 Calculus of ureter: Secondary | ICD-10-CM | POA: Diagnosis not present

## 2014-08-21 ENCOUNTER — Other Ambulatory Visit: Payer: Self-pay

## 2014-08-31 ENCOUNTER — Encounter: Payer: Medicare Other | Admitting: Gynecology

## 2014-09-07 DIAGNOSIS — N210 Calculus in bladder: Secondary | ICD-10-CM | POA: Diagnosis not present

## 2014-09-07 DIAGNOSIS — N3289 Other specified disorders of bladder: Secondary | ICD-10-CM | POA: Diagnosis not present

## 2014-09-07 DIAGNOSIS — N201 Calculus of ureter: Secondary | ICD-10-CM | POA: Diagnosis not present

## 2014-09-07 DIAGNOSIS — N200 Calculus of kidney: Secondary | ICD-10-CM | POA: Diagnosis not present

## 2014-09-08 ENCOUNTER — Encounter: Payer: Self-pay | Admitting: Gynecology

## 2014-09-08 ENCOUNTER — Ambulatory Visit (INDEPENDENT_AMBULATORY_CARE_PROVIDER_SITE_OTHER): Payer: Medicare Other | Admitting: Gynecology

## 2014-09-08 VITALS — BP 130/76 | Ht 63.0 in | Wt 164.0 lb

## 2014-09-08 DIAGNOSIS — N952 Postmenopausal atrophic vaginitis: Secondary | ICD-10-CM | POA: Diagnosis not present

## 2014-09-08 DIAGNOSIS — N816 Rectocele: Secondary | ICD-10-CM

## 2014-09-08 DIAGNOSIS — M8580 Other specified disorders of bone density and structure, unspecified site: Secondary | ICD-10-CM

## 2014-09-08 DIAGNOSIS — Z01419 Encounter for gynecological examination (general) (routine) without abnormal findings: Secondary | ICD-10-CM

## 2014-09-08 MED ORDER — ESTRADIOL 0.1 MG/GM VA CREA: 1.0000 g | TOPICAL_CREAM | VAGINAL | Status: DC

## 2014-09-08 NOTE — Patient Instructions (Signed)
You may obtain a copy of any labs that were done today by logging onto MyChart as outlined in the instructions provided with your AVS (after visit summary). The office will not call with normal lab results but certainly if there are any significant abnormalities then we will contact you.   Health Maintenance, Female A healthy lifestyle and preventative care can promote health and wellness.  Maintain regular health, dental, and eye exams.  Eat a healthy diet. Foods like vegetables, fruits, whole grains, low-fat dairy products, and lean protein foods contain the nutrients you need without too many calories. Decrease your intake of foods high in solid fats, added sugars, and salt. Get information about a proper diet from your caregiver, if necessary.  Regular physical exercise is one of the most important things you can do for your health. Most adults should get at least 150 minutes of moderate-intensity exercise (any activity that increases your heart rate and causes you to sweat) each week. In addition, most adults need muscle-strengthening exercises on 2 or more days a week.   Maintain a healthy weight. The body mass index (BMI) is a screening tool to identify possible weight problems. It provides an estimate of body fat based on height and weight. Your caregiver can help determine your BMI, and can help you achieve or maintain a healthy weight. For adults 20 years and older:  A BMI below 18.5 is considered underweight.  A BMI of 18.5 to 24.9 is normal.  A BMI of 25 to 29.9 is considered overweight.  A BMI of 30 and above is considered obese.  Maintain normal blood lipids and cholesterol by exercising and minimizing your intake of saturated fat. Eat a balanced diet with plenty of fruits and vegetables. Blood tests for lipids and cholesterol should begin at age 61 and be repeated every 5 years. If your lipid or cholesterol levels are high, you are over 50, or you are a high risk for heart  disease, you may need your cholesterol levels checked more frequently.Ongoing high lipid and cholesterol levels should be treated with medicines if diet and exercise are not effective.  If you smoke, find out from your caregiver how to quit. If you do not use tobacco, do not start.  Lung cancer screening is recommended for adults aged 33 80 years who are at high risk for developing lung cancer because of a history of smoking. Yearly low-dose computed tomography (CT) is recommended for people who have at least a 30-pack-year history of smoking and are a current smoker or have quit within the past 15 years. A pack year of smoking is smoking an average of 1 pack of cigarettes a day for 1 year (for example: 1 pack a day for 30 years or 2 packs a day for 15 years). Yearly screening should continue until the smoker has stopped smoking for at least 15 years. Yearly screening should also be stopped for people who develop a health problem that would prevent them from having lung cancer treatment.  If you are pregnant, do not drink alcohol. If you are breastfeeding, be very cautious about drinking alcohol. If you are not pregnant and choose to drink alcohol, do not exceed 1 drink per day. One drink is considered to be 12 ounces (355 mL) of beer, 5 ounces (148 mL) of wine, or 1.5 ounces (44 mL) of liquor.  Avoid use of street drugs. Do not share needles with anyone. Ask for help if you need support or instructions about stopping  the use of drugs.  High blood pressure causes heart disease and increases the risk of stroke. Blood pressure should be checked at least every 1 to 2 years. Ongoing high blood pressure should be treated with medicines, if weight loss and exercise are not effective.  If you are 59 to 70 years old, ask your caregiver if you should take aspirin to prevent strokes.  Diabetes screening involves taking a blood sample to check your fasting blood sugar level. This should be done once every 3  years, after age 91, if you are within normal weight and without risk factors for diabetes. Testing should be considered at a younger age or be carried out more frequently if you are overweight and have at least 1 risk factor for diabetes.  Breast cancer screening is essential preventative care for women. You should practice "breast self-awareness." This means understanding the normal appearance and feel of your breasts and may include breast self-examination. Any changes detected, no matter how small, should be reported to a caregiver. Women in their 66s and 30s should have a clinical breast exam (CBE) by a caregiver as part of a regular health exam every 1 to 3 years. After age 101, women should have a CBE every year. Starting at age 100, women should consider having a mammogram (breast X-ray) every year. Women who have a family history of breast cancer should talk to their caregiver about genetic screening. Women at a high risk of breast cancer should talk to their caregiver about having an MRI and a mammogram every year.  Breast cancer gene (BRCA)-related cancer risk assessment is recommended for women who have family members with BRCA-related cancers. BRCA-related cancers include breast, ovarian, tubal, and peritoneal cancers. Having family members with these cancers may be associated with an increased risk for harmful changes (mutations) in the breast cancer genes BRCA1 and BRCA2. Results of the assessment will determine the need for genetic counseling and BRCA1 and BRCA2 testing.  The Pap test is a screening test for cervical cancer. Women should have a Pap test starting at age 57. Between ages 25 and 35, Pap tests should be repeated every 2 years. Beginning at age 37, you should have a Pap test every 3 years as long as the past 3 Pap tests have been normal. If you had a hysterectomy for a problem that was not cancer or a condition that could lead to cancer, then you no longer need Pap tests. If you are  between ages 50 and 76, and you have had normal Pap tests going back 10 years, you no longer need Pap tests. If you have had past treatment for cervical cancer or a condition that could lead to cancer, you need Pap tests and screening for cancer for at least 20 years after your treatment. If Pap tests have been discontinued, risk factors (such as a new sexual partner) need to be reassessed to determine if screening should be resumed. Some women have medical problems that increase the chance of getting cervical cancer. In these cases, your caregiver may recommend more frequent screening and Pap tests.  The human papillomavirus (HPV) test is an additional test that may be used for cervical cancer screening. The HPV test looks for the virus that can cause the cell changes on the cervix. The cells collected during the Pap test can be tested for HPV. The HPV test could be used to screen women aged 44 years and older, and should be used in women of any age  who have unclear Pap test results. After the age of 55, women should have HPV testing at the same frequency as a Pap test.  Colorectal cancer can be detected and often prevented. Most routine colorectal cancer screening begins at the age of 44 and continues through age 20. However, your caregiver may recommend screening at an earlier age if you have risk factors for colon cancer. On a yearly basis, your caregiver may provide home test kits to check for hidden blood in the stool. Use of a small camera at the end of a tube, to directly examine the colon (sigmoidoscopy or colonoscopy), can detect the earliest forms of colorectal cancer. Talk to your caregiver about this at age 86, when routine screening begins. Direct examination of the colon should be repeated every 5 to 10 years through age 13, unless early forms of pre-cancerous polyps or small growths are found.  Hepatitis C blood testing is recommended for all people born from [birth year] through 1966 and any  individual with known risks for hepatitis C.  Practice safe sex. Use condoms and avoid high-risk sexual practices to reduce the spread of sexually transmitted infections (STIs). Sexually active women aged 36 and younger should be checked for Chlamydia, which is a common sexually transmitted infection. Older women with new or multiple partners should also be tested for Chlamydia. Testing for other STIs is recommended if you are sexually active and at increased risk.  Osteoporosis is a disease in which the bones lose minerals and strength with aging. This can result in serious bone fractures. The risk of osteoporosis can be identified using a bone density scan. Women ages 20 and over and women at risk for fractures or osteoporosis should discuss screening with their caregivers. Ask your caregiver whether you should be taking a calcium supplement or vitamin D to reduce the rate of osteoporosis.  Menopause can be associated with physical symptoms and risks. Hormone replacement therapy is available to decrease symptoms and risks. You should talk to your caregiver about whether hormone replacement therapy is right for you.  Use sunscreen. Apply sunscreen liberally and repeatedly throughout the day. You should seek shade when your shadow is shorter than you. Protect yourself by wearing long sleeves, pants, a wide-brimmed hat, and sunglasses year round, whenever you are outdoors.  Notify your caregiver of new moles or changes in moles, especially if there is a change in shape or color. Also notify your caregiver if a mole is larger than the size of a pencil eraser.  Stay current with your immunizations. Document Released: 08/26/2010 Document Revised: 06/07/2012 Document Reviewed: 08/26/2010 [facility] Patient Information 2014 [employer].

## 2014-09-08 NOTE — Progress Notes (Signed)
Lynn Collins 06/05/1945 [id number]        69 y.o.  [id number] for breast and pelvic exam. Several issues noted below.  Past medical history,surgical history, problem list, medications, allergies, family history and social history were all reviewed and documented as reviewed in the EPIC chart.  ROS:  Performed with pertinent positives and negatives included in the history, assessment and plan.   Additional significant findings :  Recent kidney stone actively being followed by Dr. Janice Norrie   Exam: Maudie Mercury assistant Filed Vitals:   09/08/14 1149  BP: 130/76  Height: 5\' 3"  (1.6 m)  Weight: 164 lb (74.39 kg)   General appearance:  Normal affect, orientation and appearance. Skin: Grossly normal HEENT: Without gross lesions.  No cervical or supraclavicular adenopathy. Thyroid normal.  Lungs:  Clear without wheezing, rales or rhonchi Cardiac: RR, without RMG Abdominal:  Soft, nontender, without masses, guarding, rebound, organomegaly or hernia Breasts:  Examined lying and sitting without masses, retractions, discharge or axillary adenopathy. Pelvic:  Ext/BUS/vagina with atrophic changes. First to second-degree rectocele noted.  Cervix with atrophic changes  Uterus anteverted, normal size, shape and contour, midline and mobile nontender   Adnexa  Without masses or tenderness    Anus and perineum  Normal   Rectovaginal  Normal sphincter tone without palpated masses or tenderness.    Assessment/Plan:  69 y.o. [name] for breast and pelvic exam.  1. Postmenopausal/atrophic genital changes.  Doing well. Does use occasional Estrace vaginal cream for dryness. I again reviewed the issues of absorption and possible systemic effects to include thrombosis such as stroke heart attack DVT, breast cancer and endometrial stimulation. Refill provided. Patient knows to report any vaginal bleeding. 2. Ovarian fibroma. Being followed by Dr. Alycia Rossetti with ultrasound 11/2013 negative. Endometrial echo was 1  mm. She'll continue to follow up with Dr. Elenora Gamma recommendation. 3. Rectocele.  First to second-degree on exam. Stable over time. Asymptomatic to the patient. We'll continue to monitor with annual exams. 4. Osteopenia. DEXA 10/2013 T score -1.4 FRAX 15%/2.8%.  Increased calcium vitamin D reviewed. Repeat DEXA at two-year interval. 5. Mammography 10/2013.  Continue with annual mammography. SBE monthly reviewed. 6. Pap smear 2015.  No Pap smear done today. Reviewed current screening guidelines and options to stop screening issues over the age of 35 and no history of abnormal Pap smears previously. Will readdress on annual basis. 7. Colonoscopy 2014.  Repeat at their recommended interval. 8. Health maintenance.  No routine blood work done as done at her primary physician's office. Follow up in one year, sooner as needed.   Anastasio Auerbach MD, 12:13 PM 09/08/2014

## 2014-09-14 DIAGNOSIS — N390 Urinary tract infection, site not specified: Secondary | ICD-10-CM | POA: Diagnosis not present

## 2014-09-14 DIAGNOSIS — N201 Calculus of ureter: Secondary | ICD-10-CM | POA: Diagnosis not present

## 2014-09-22 DIAGNOSIS — Z7982 Long term (current) use of aspirin: Secondary | ICD-10-CM | POA: Diagnosis not present

## 2014-09-22 DIAGNOSIS — Z79899 Other long term (current) drug therapy: Secondary | ICD-10-CM | POA: Diagnosis not present

## 2014-09-22 DIAGNOSIS — Z888 Allergy status to other drugs, medicaments and biological substances status: Secondary | ICD-10-CM | POA: Diagnosis not present

## 2014-09-22 DIAGNOSIS — R399 Unspecified symptoms and signs involving the genitourinary system: Secondary | ICD-10-CM | POA: Diagnosis not present

## 2014-09-22 DIAGNOSIS — E042 Nontoxic multinodular goiter: Secondary | ICD-10-CM | POA: Diagnosis not present

## 2014-09-22 DIAGNOSIS — E039 Hypothyroidism, unspecified: Secondary | ICD-10-CM | POA: Diagnosis not present

## 2014-09-22 DIAGNOSIS — Z882 Allergy status to sulfonamides status: Secondary | ICD-10-CM | POA: Diagnosis not present

## 2014-09-22 DIAGNOSIS — E782 Mixed hyperlipidemia: Secondary | ICD-10-CM | POA: Diagnosis not present

## 2014-09-22 DIAGNOSIS — N200 Calculus of kidney: Secondary | ICD-10-CM | POA: Diagnosis not present

## 2014-09-22 DIAGNOSIS — N951 Menopausal and female climacteric states: Secondary | ICD-10-CM | POA: Diagnosis not present

## 2014-09-22 DIAGNOSIS — I10 Essential (primary) hypertension: Secondary | ICD-10-CM | POA: Diagnosis not present

## 2014-11-06 ENCOUNTER — Other Ambulatory Visit: Payer: Self-pay

## 2014-11-06 DIAGNOSIS — Z1231 Encounter for screening mammogram for malignant neoplasm of breast: Secondary | ICD-10-CM

## 2014-11-06 DIAGNOSIS — E039 Hypothyroidism, unspecified: Secondary | ICD-10-CM | POA: Diagnosis not present

## 2014-11-06 DIAGNOSIS — E782 Mixed hyperlipidemia: Secondary | ICD-10-CM | POA: Diagnosis not present

## 2014-11-06 DIAGNOSIS — I10 Essential (primary) hypertension: Secondary | ICD-10-CM | POA: Diagnosis not present

## 2014-11-09 DIAGNOSIS — Z0000 Encounter for general adult medical examination without abnormal findings: Secondary | ICD-10-CM | POA: Diagnosis not present

## 2014-11-09 DIAGNOSIS — I10 Essential (primary) hypertension: Secondary | ICD-10-CM | POA: Diagnosis not present

## 2014-11-09 DIAGNOSIS — Z23 Encounter for immunization: Secondary | ICD-10-CM | POA: Diagnosis not present

## 2014-11-09 DIAGNOSIS — E782 Mixed hyperlipidemia: Secondary | ICD-10-CM | POA: Diagnosis not present

## 2014-11-13 ENCOUNTER — Ambulatory Visit: Payer: Medicare Other

## 2014-11-16 DIAGNOSIS — M542 Cervicalgia: Secondary | ICD-10-CM | POA: Diagnosis not present

## 2014-11-16 DIAGNOSIS — M62838 Other muscle spasm: Secondary | ICD-10-CM | POA: Diagnosis not present

## 2014-11-20 DIAGNOSIS — N201 Calculus of ureter: Secondary | ICD-10-CM | POA: Diagnosis not present

## 2014-11-28 ENCOUNTER — Ambulatory Visit: Payer: Medicare Other

## 2014-11-29 ENCOUNTER — Encounter: Payer: Self-pay | Admitting: Gynecologic Oncology

## 2014-11-29 ENCOUNTER — Ambulatory Visit: Payer: Medicare Other | Attending: Gynecologic Oncology | Admitting: Gynecologic Oncology

## 2014-11-29 VITALS — BP 142/86 | HR 72 | Temp 98.2°F | Resp 18 | Ht 63.0 in | Wt 166.5 lb

## 2014-11-29 DIAGNOSIS — N839 Noninflammatory disorder of ovary, fallopian tube and broad ligament, unspecified: Secondary | ICD-10-CM

## 2014-11-29 DIAGNOSIS — N838 Other noninflammatory disorders of ovary, fallopian tube and broad ligament: Secondary | ICD-10-CM

## 2014-11-29 NOTE — Patient Instructions (Signed)
[place] with Dr. Phineas Real as scheduled. You do not need to schedule a followup with Dr. Alycia Rossetti at this time but please call our office in the future with any questions or concerns.

## 2014-11-29 NOTE — Progress Notes (Signed)
Consult Note: Gyn-Onc  Lynn Collins 69 y.o. female  CC:  Chief Complaint  Patient presents with  . ovarian Collins    Follow up    HPI: HISTORY: HISTORY OF PRESENT ILLNESS: The patient is a 69 year old gravida4, para 3, aborta 1 who had an annual examination in 01/2011 that was negative. In 09/2011, she went to her 79 office for a routine bone density and at that time complained of some left lower quadrant discomfort. Urinalysis was negative; however, she underwent a pelvic ultrasound in August that revealed an anteverted uterus with a 1-cm fibroid. The right ovary was atrophic with numerous calcifications. The left ovary was atrophic with a solid focus,measuring 1.9 x 1.4 cm. The area was avascular. There was no freefluid. She was seen by me in [place] for evaluation of this on 12/03/2011. As her CA-125 was normal and the suspicion of this being a malignant process was very low, she opted for close followup.She had a repeat ultrasound done with Dr. Cherylann Banas on 01/19/12 and now reveals a solid focus and her right ovary measuring 1.6 cm with no blood flow within the Collins. The cul-de-sac was free of fluid.    INTERVAL HISTORY: I last saw her in 9/15. COMPARISON: Pelvic ultrasound 08/02/12 and earlier  TECHNIQUE: Ultrasound views of the pelvis were obtained endovaginally using gray scale and color Doppler imaging.  FINDINGS: The uterus measured 5.1 x 2.8 x 3.9 cm in size (previously a 4.1 x 2.6 x 3.9 cm). A few intramural fibroids are seen. 1. Left intramural fibroid measures 0.8 x 1.1 x 0.7 cm, previously 0.9 x 1.2 x 0.5 cm. 2. Right fundal intramural fibroid measures 0.4 x 0.5 x 0.5 cm, stable. 3. Midline fundal intramural fibroid measures 0.4 x 0.7 x 0.4 cm, stable.  The endometrium measured 1.0 mm. There is trace fluid at the endocervical canal.  The right ovary measured 1.7 x 0.9 x 1.3 cm (previously 2.0 x 1.2 x 1.5 cm). In the expected region of the left ovary, there is a  hypoechoic oval structure measuring 1.8 x 0.9 x 1.1 cm (previously left ovary was not visualized), possibly representing left ovary or the suspected fibroma according to EPIC. No flow is seen in this structure. Flow is seen in the right ovary with color Doppler. No spectral Doppler evaluation was performed. No abnormal pelvic fluid was seen.  IMPRESSION: -- A few stable small intramural uterine fibroids.  -- Solid left adnexal structure without definite vascularity, possibly atrophic left ovary or suspected fibroma according to EPIC.   At that time we felt that she did not need a return follow-up secondary to long-term stability and no symptoms. She had a CT scan performed for follow-up of questionable ureteral stone 09/07/2014. This was done at [facility] urology specialist. Comment that the uterus and the adnexal structures were unremarkable. She was seen by Dr. Phineas Real for routine GYN care in July 2016. At that time her exam was unremarkable. She is overall doing quite well and really denies any significant complaints. She just enjoyed a birthday this weekend and her daughters came into town and she recently discussed back from [place] where she went to the African-American history Ms. Harriette Ohara was most impressed.  She is being followed by [facility] for a kidney stone. She took Flomax and does not feel like she passed the stone. She denies any hematuria or pain. She does have some diverticular disease and occasionally she'll have some cramping if she eats certain foods. Her  review of systems otherwise negative. She is due for a mammogram. One daughter is been diagnosed with thyroid cancer. She had surgery as well as irradiated iodine. The patient herself is followed by Dr. Manus Rudd at [facility] in the endocrinology clinic.  Review of Systems  Constitutional: Denies fever. Skin: No rash Cardiovascular: No chest pain, shortness of breath, or edema  Gastro Intestinal: No nausea, vomiting,  constipation, or diarrhea reported. No bright red blood per rectum or change in bowel movement. Other discomfort as above. Genitourinary: No frequency, urgency, or dysuria.  Denies vaginal bleeding and discharge.  Musculoskeletal: No joint swelling or pain.  Psychology: No changes  Current Meds:  Outpatient Encounter Prescriptions as of 11/29/2014  Medication Sig  . AMLODIPINE BESYLATE PO Take 2.5 mg by mouth daily.   . Ascorbic Acid (VITAMIN C PO) Take by mouth.    . Calcium Carbonate-Vit D-Min (CALTRATE PLUS PO) Take by mouth 2 (two) times daily.    [name] Kitchen levothyroxine (SYNTHROID, LEVOTHROID) 100 MCG tablet Take 100 mcg by mouth daily before breakfast.  . multivitamin (THERAGRAN) per tablet Take 1 tablet by mouth daily.    [name] Kitchen aspirin 81 MG chewable tablet Chew 81 mg by mouth as needed.    [name] Kitchen estradiol (ESTRACE) 0.1 MG/GM vaginal cream Place 5.63 Applicatorfuls vaginally 2 (two) times a week. 2 x weekly (Patient not taking: Reported on 11/29/2014)  . Loratadine (CLARITIN PO) Take by mouth.   No facility-administered encounter medications on file as of 11/29/2014.    Allergy:  Allergies  Allergen Reactions  . Kapidex [Dexlansoprazole] Rash  . Sulfonamide Derivatives Rash    Social Hx:   Social History   Social History  . Marital Status: Married    Spouse Name: N/A  . Number of Children: N/A  . Years of Education: N/A   Occupational History  . Not on file.   Social History Main Topics  . Smoking status: Never Smoker   . Smokeless tobacco: Not on file  . Alcohol Use: 0.0 oz/week    0 Standard drinks or equivalent per week     Comment: rare  . Drug Use: No  . Sexual Activity: Yes    Birth Control/ Protection: Surgical     Comment: Tubal lig-1st intercourse 20 yo-1 partner   Other Topics Concern  . Not on file   Social History Narrative    Past Surgical Hx:  Past Surgical History  Procedure Laterality Date  . Tubal ligation    . Cholecystectomy    . Thyroid nodule bx       Benign    Past Medical Hx:  Past Medical History  Diagnosis Date  . Atrophic vaginitis   . Rectocele   . Hemorrhoids   . Varicose veins   . Exposure to TB     possible per pt  . Hypertension   . Osteopenia 10/2013    T score -1.4 FRAX 15%/2.8%  . Hypothyroidism     Nodule  . Diverticulitis   . Kidney stone     Oncology Hx:   No history exists.    Family Hx:  Family History  Problem Relation Age of Onset  . Diabetes Mother   . Hypertension Mother   . Uterine cancer Mother   . Heart disease Mother   . Cancer Father     prostate  . Colon cancer Paternal Aunt   . Heart attack Brother   . Cancer Daughter     Thyroid cancer    Vitals:  Blood pressure 142/86, pulse 72, temperature 98.2 F (36.8 C), temperature source Oral, resp. rate 18, height 5\' 3"  (1.6 m), weight 166 lb 8 oz (75.524 kg), SpO2 100 %.  Physical Exam: General: Alert, oriented, no acute distress.  Neck: Supple, no lymphadenopathy, no thyromegaly  Cardiovascular: Regular rate and rhythm, no murmurs.  Respiratory: Clear to auscultation bilaterally.   Abdomen: Soft, nontender. The bowel sounds are normoactive. No masses or hepatosplenomegaly appreciated.   Extremities: Normal range of motion of the upper and lower extremities. No edema bilaterally.  Skin: No rashes or lesions noted.  Lymphatics: No cervical, supraclavicular, or inguinal adenopathy  GU: Normal appearing external genitalia without erythema, excoriation, or lesions. Bimanual exam reveals the uterus is of normal size shape and consistency. There are no adnexal masses.  Assessment/Plan: ASSESSMENT AND PLAN: TRESSIA LABRUM 69 y.o. female with a history of a left ovarian Collins possibly a fibroma which has been stable for several years and recent CT scan did not reveal any ovarian masses.  This is been followed for several years and does not appear to be causing her any symptoms. She'll be released from our clinic. She knows that  she will follow-up with Dr. Phineas Real as scheduled. She does all be happy to see her in the future should the need arise.  Nancy Marus A., MD 11/29/2014, 3:21 PM

## 2014-11-29 NOTE — Addendum Note (Signed)
Addended by: Elizebeth Koller on: 11/29/2014 03:52 PM   Modules accepted: Medications

## 2014-12-01 IMAGING — MG MM SCREEN MAMMOGRAM BILATERAL
4 series · 4 of 4 positions shown · non-contrast
Comparison: Previous exam(s).

CLINICAL DATA: Screening.

DIGITAL SCREENING BILATERAL MAMMOGRAM WITH CAD

[R CC]
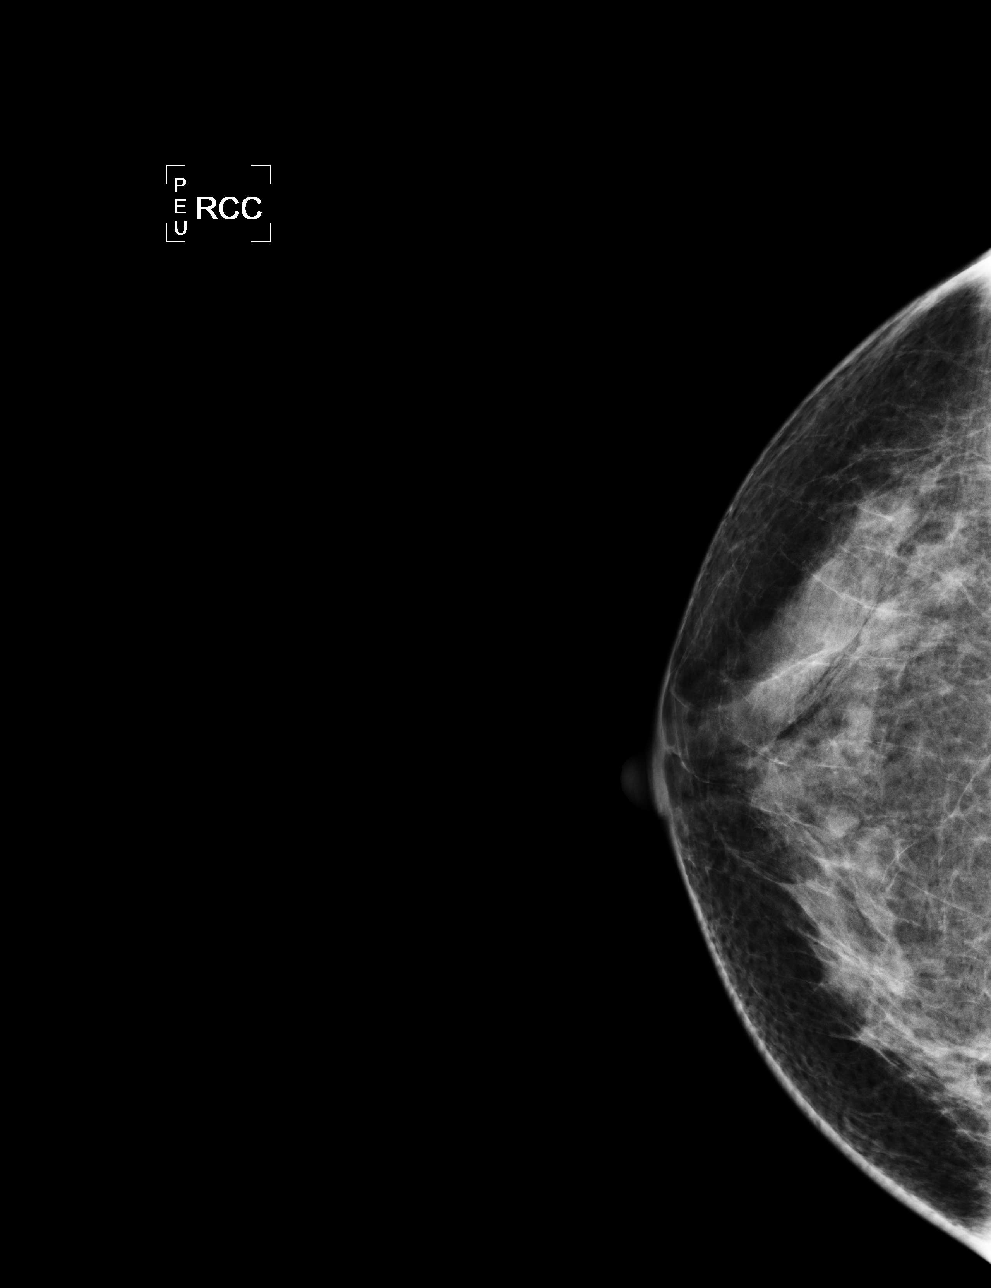

[L CC]
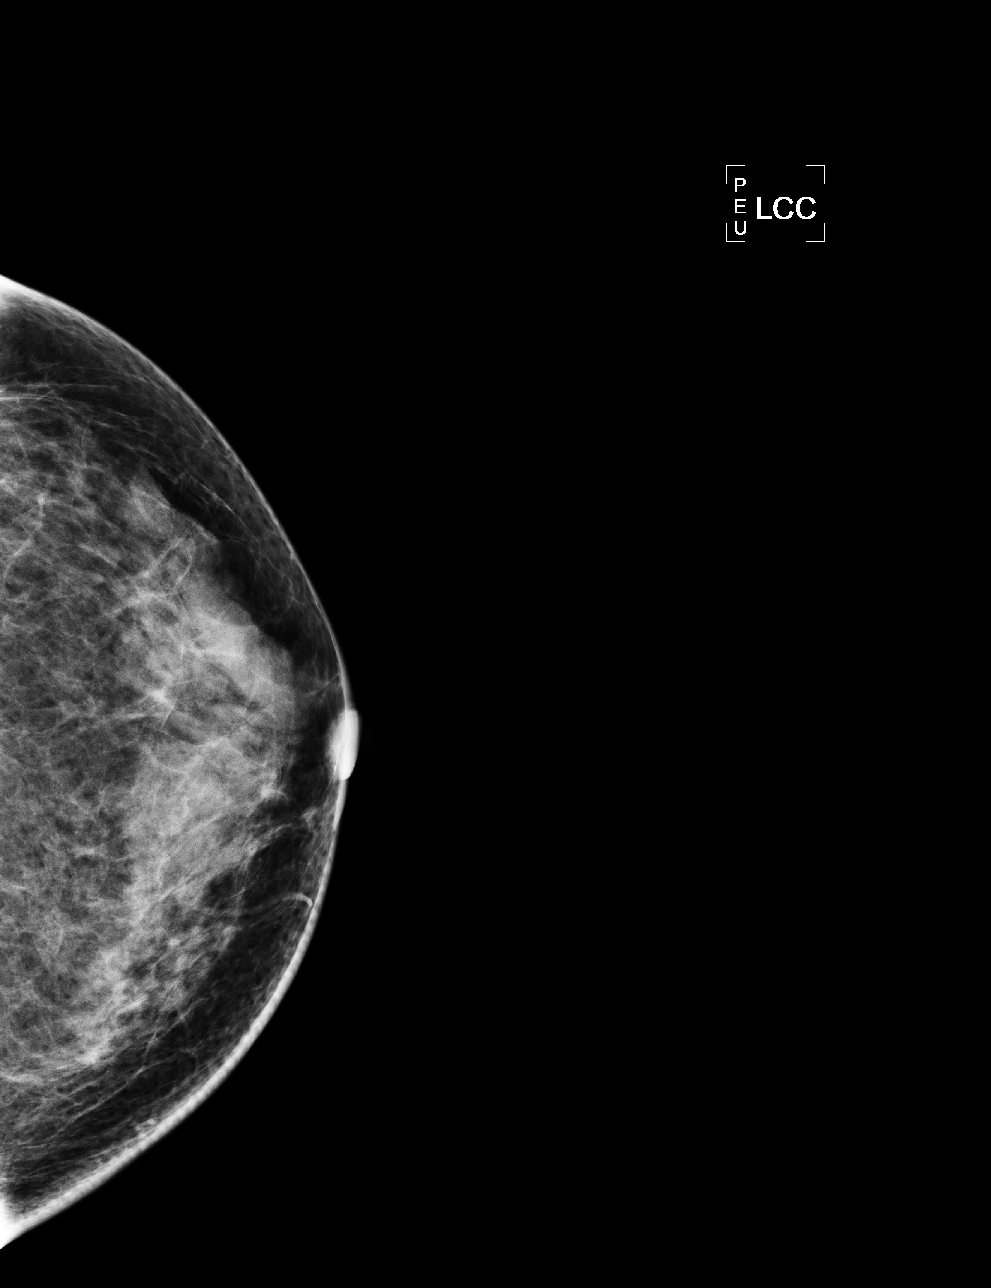

[L MLO]
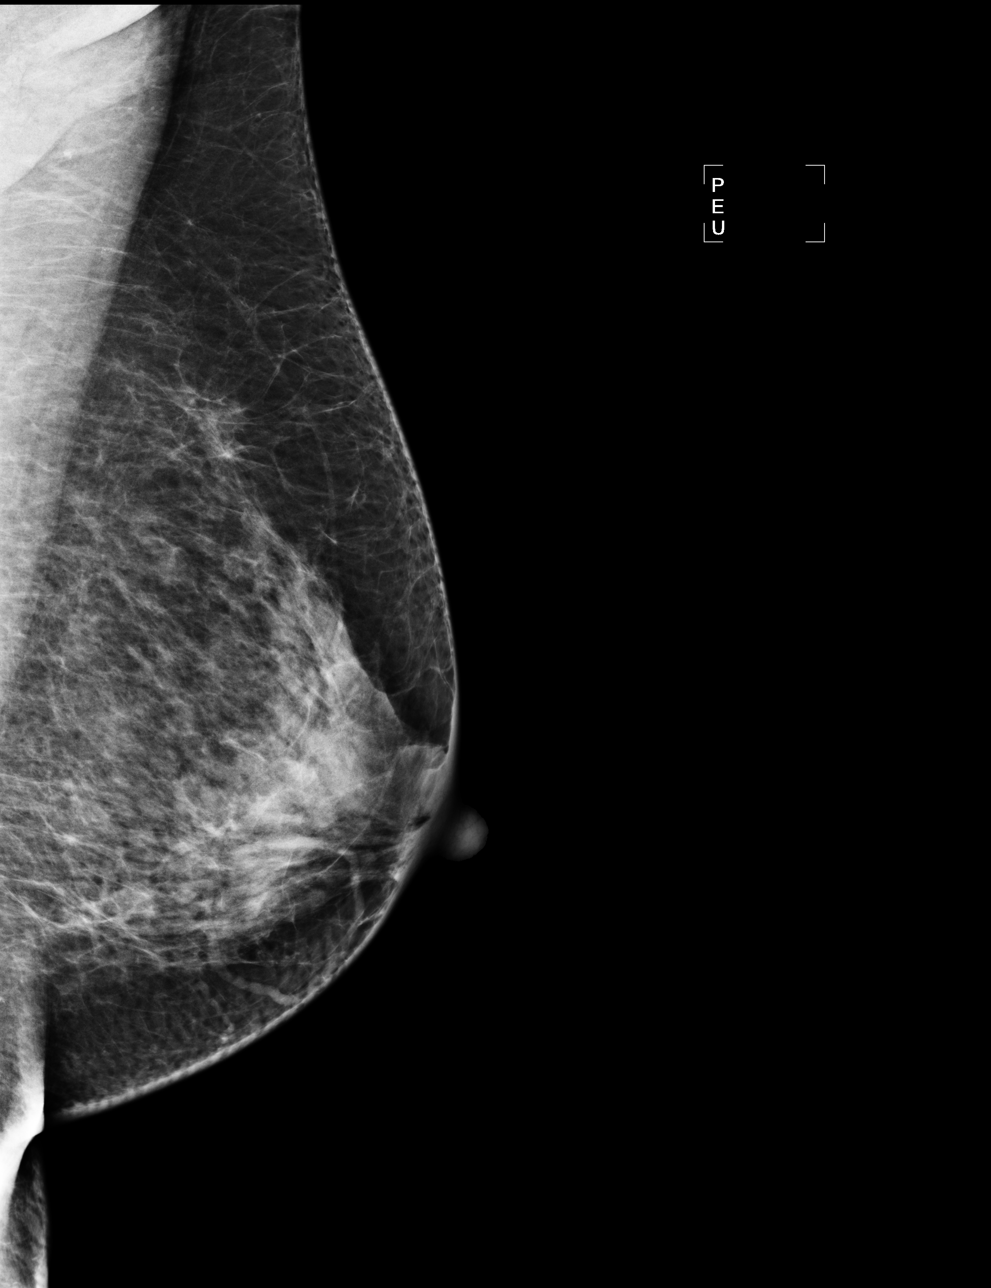

[R MLO]
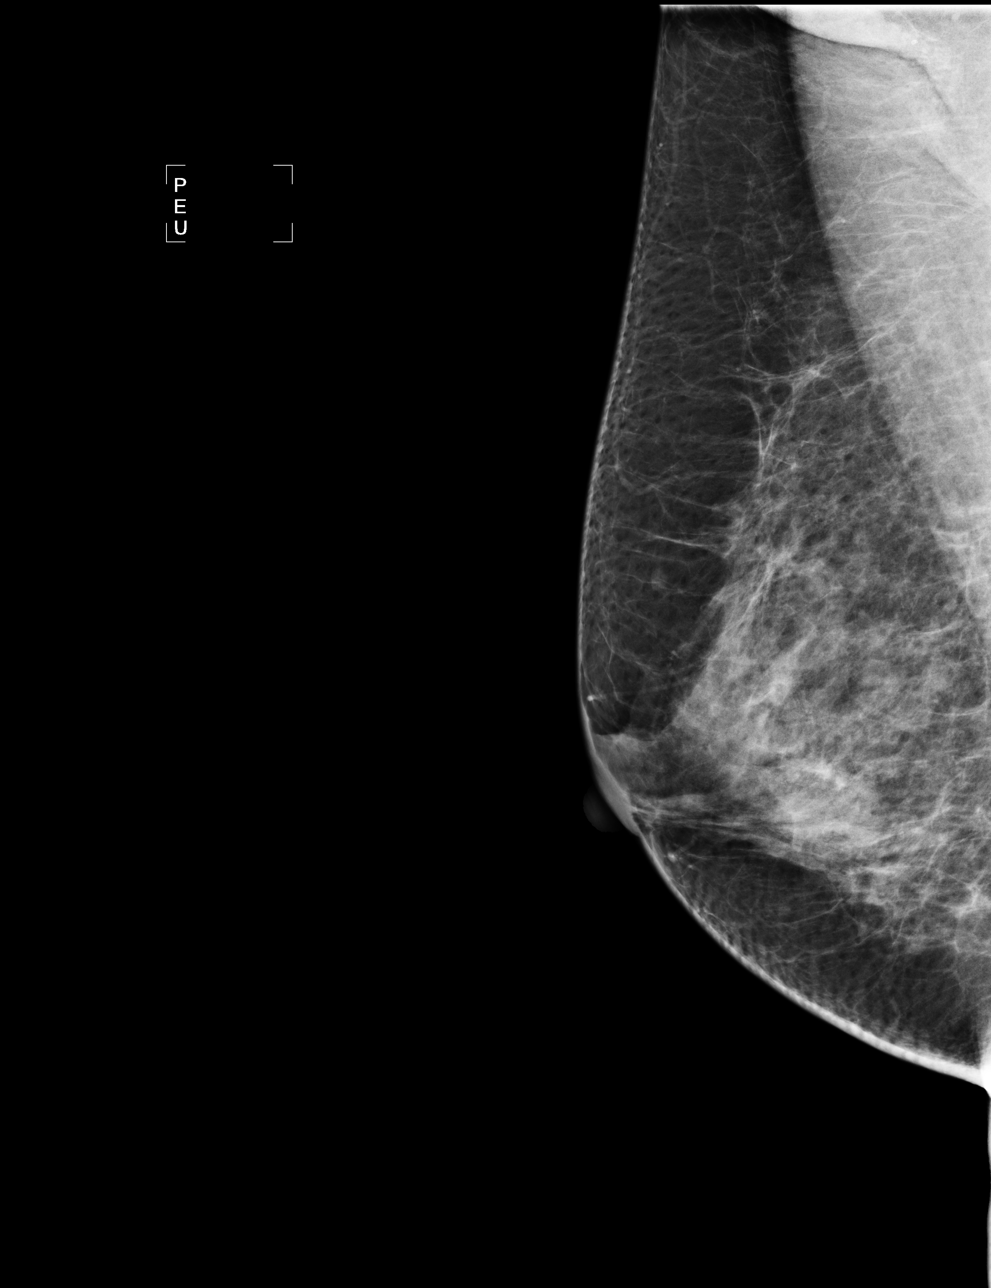

[4 of 4 positions shown; findings below may reference images not displayed]

FINDINGS: ACR Breast Density Category c:  The breast tissue is
heterogeneously dense, which may obscure small masses.

There are no findings suspicious for malignancy.

Images were processed with CAD.
IMPRESSION: No mammographic evidence of malignancy.

A result letter of this screening mammogram will be mailed directly
to the patient.

RECOMMENDATION:
Screening mammogram in one year. (Code:HG-[id number])

BI-RADS CATEGORY 1:  Negative.

## 2014-12-15 DIAGNOSIS — E042 Nontoxic multinodular goiter: Secondary | ICD-10-CM | POA: Diagnosis not present

## 2014-12-20 DIAGNOSIS — J0100 Acute maxillary sinusitis, unspecified: Secondary | ICD-10-CM | POA: Diagnosis not present

## 2014-12-25 ENCOUNTER — Ambulatory Visit
Admission: RE | Admit: 2014-12-25 | Discharge: 2014-12-25 | Disposition: A | Discharge status: Home / Self Care | Payer: Medicare Other | Source: Ambulatory Visit

## 2014-12-25 DIAGNOSIS — Z1231 Encounter for screening mammogram for malignant neoplasm of breast: Secondary | ICD-10-CM

## 2015-01-26 DIAGNOSIS — N201 Calculus of ureter: Secondary | ICD-10-CM | POA: Diagnosis not present

## 2015-03-09 DIAGNOSIS — N200 Calculus of kidney: Secondary | ICD-10-CM | POA: Diagnosis not present

## 2015-03-09 DIAGNOSIS — E039 Hypothyroidism, unspecified: Secondary | ICD-10-CM | POA: Diagnosis not present

## 2015-03-09 DIAGNOSIS — I10 Essential (primary) hypertension: Secondary | ICD-10-CM | POA: Diagnosis not present

## 2015-03-09 DIAGNOSIS — E042 Nontoxic multinodular goiter: Secondary | ICD-10-CM | POA: Diagnosis not present

## 2015-03-09 DIAGNOSIS — E782 Mixed hyperlipidemia: Secondary | ICD-10-CM | POA: Diagnosis not present

## 2015-03-09 DIAGNOSIS — Z79899 Other long term (current) drug therapy: Secondary | ICD-10-CM | POA: Diagnosis not present

## 2015-04-01 DIAGNOSIS — R002 Palpitations: Secondary | ICD-10-CM | POA: Diagnosis not present

## 2015-04-01 DIAGNOSIS — Z87448 Personal history of other diseases of urinary system: Secondary | ICD-10-CM | POA: Diagnosis not present

## 2015-04-01 DIAGNOSIS — Z87442 Personal history of urinary calculi: Secondary | ICD-10-CM | POA: Insufficient documentation

## 2015-04-01 DIAGNOSIS — E039 Hypothyroidism, unspecified: Secondary | ICD-10-CM | POA: Diagnosis not present

## 2015-04-01 DIAGNOSIS — Z8719 Personal history of other diseases of the digestive system: Secondary | ICD-10-CM | POA: Insufficient documentation

## 2015-04-01 DIAGNOSIS — I10 Essential (primary) hypertension: Secondary | ICD-10-CM | POA: Insufficient documentation

## 2015-04-01 DIAGNOSIS — Z79899 Other long term (current) drug therapy: Secondary | ICD-10-CM | POA: Diagnosis not present

## 2015-04-01 DIAGNOSIS — Z8742 Personal history of other diseases of the female genital tract: Secondary | ICD-10-CM | POA: Insufficient documentation

## 2015-04-01 DIAGNOSIS — R000 Tachycardia, unspecified: Secondary | ICD-10-CM | POA: Diagnosis present

## 2015-04-01 DIAGNOSIS — Z7982 Long term (current) use of aspirin: Secondary | ICD-10-CM | POA: Insufficient documentation

## 2015-04-02 ENCOUNTER — Encounter (HOSPITAL_COMMUNITY): Payer: Self-pay | Admitting: *Deleted

## 2015-04-02 ENCOUNTER — Emergency Department (HOSPITAL_COMMUNITY)
Admission: EM | Admit: 2015-04-02 | Discharge: 2015-04-02 | Disposition: A | Discharge status: Home / Self Care | Payer: Medicare Other | Attending: Emergency Medicine | Admitting: Emergency Medicine

## 2015-04-02 ENCOUNTER — Emergency Department (HOSPITAL_COMMUNITY): Payer: Medicare Other

## 2015-04-02 DIAGNOSIS — I10 Essential (primary) hypertension: Secondary | ICD-10-CM

## 2015-04-02 DIAGNOSIS — R002 Palpitations: Secondary | ICD-10-CM

## 2015-04-02 LAB — T4, FREE: Free T4: 0.97 ng/dL (ref 0.61–1.12)

## 2015-04-02 LAB — TROPONIN I: Troponin I: <0.03 ng/mL (ref <0.031)

## 2015-04-02 LAB — BASIC METABOLIC PANEL
ANION GAP: 12 (ref 5–15)
BUN: 12 mg/dL (ref 6–20)
CHLORIDE: 106 mmol/L (ref 101–111)
CO2: 25 mmol/L (ref 22–32)
Calcium: 9.3 mg/dL (ref 8.9–10.3)
Creatinine, Ser: 1.02 mg/dL — ABNORMAL HIGH (ref 0.44–1.00)
GFR calc non Af Amer: 54 mL/min — ABNORMAL LOW (ref >60)
GLUCOSE: 150 mg/dL — AB (ref 65–99)
POTASSIUM: 3.5 mmol/L (ref 3.5–5.1)
Sodium: 143 mmol/L (ref 135–145)

## 2015-04-02 LAB — CBC
HEMATOCRIT: 41.7 % (ref 36.0–46.0)
Hemoglobin: 14.1 g/dL (ref 12.0–15.0)
MCH: 30.1 pg (ref 26.0–34.0)
MCHC: 33.8 g/dL (ref 30.0–36.0)
MCV: 89.1 fL (ref 78.0–100.0)
Platelets: 282 10*3/uL (ref 150–400)
RBC: 4.68 MIL/uL (ref 3.87–5.11)
RDW: 13.1 % (ref 11.5–15.5)
WBC: 10.7 10*3/uL — ABNORMAL HIGH (ref 4.0–10.5)

## 2015-04-02 LAB — TSH: TSH: 4.251 u[IU]/mL (ref 0.350–4.500)

## 2015-04-02 NOTE — ED Notes (Signed)
The pt is c/o a rapid heart rate since 2100 and her bp has been elevated,  She feels anxious

## 2015-04-02 NOTE — ED Provider Notes (Signed)
CSN: [id number]     Arrival date & time 04/01/15  2348 History  By signing my name below, I, Helane Gunther, attest that this documentation has been prepared under the direction and in the presence of Orpah Greek, MD. Electronically Signed: Helane Gunther, ED Scribe. 04/02/2015. 3:22 AM.     Chief Complaint  Patient presents with  . Tachycardia   The history is provided by the patient. No language interpreter was used.   HPI Comments: Lynn Collins is a 70 y.o. female with a PMHx of HTN and hypothyroidism who presents to the Emergency Department complaining of tachycardia onset 4 hours ago. Pt states she was watching TV when the palpitations began, stating she felt her heart pulsing through her chest. She notes she thought this was acid reflux, but grew concerned when the pain persisted. She then checked her BP which was measured at 190/128. She notes she is on 2.5 mg amlodipine, which she took before coming to the ED. She also reports her usual BP is at 124/80 at baseline. She notes a PMHx of a nodule on her thyroid, for which she is being followed and taking 100 mg synthroid. She has been seen for this on 1/17 when her values were found to be low. She states she has also been using Flonase recently. She states she has been seen by a cardiologist and was given a stress test 3 years ago when her BP rose for the first time.   Past Medical History  Diagnosis Date  . Atrophic vaginitis   . Rectocele   . Hemorrhoids   . Varicose veins   . Exposure to TB     possible per pt  . Hypertension   . Osteopenia 10/2013    T score -1.4 FRAX 15%/2.8%  . Hypothyroidism     Nodule  . Diverticulitis   . Kidney stone    Past Surgical History  Procedure Laterality Date  . Tubal ligation    . Cholecystectomy    . Thyroid nodule bx      Benign   Family History  Problem Relation Age of Onset  . Diabetes Mother   . Hypertension Mother   . Uterine cancer Mother   . Heart disease Mother    . Cancer Father     prostate  . Colon cancer Paternal Aunt   . Heart attack Brother   . Cancer Daughter     Thyroid cancer   Social History  Substance Use Topics  . Smoking status: Never Smoker   . Smokeless tobacco: None  . Alcohol Use: 0.0 oz/week    0 Standard drinks or equivalent per week     Comment: rare   OB History    Gravida Para Term Preterm AB TAB SAB Ectopic Multiple Living   4 3 3  1  1   3      Review of Systems  Cardiovascular: Positive for palpitations.  All other systems reviewed and are negative.   Allergies  Kapidex and Sulfonamide derivatives  Home Medications   Prior to Admission medications   Medication Sig Start Date End Date Taking? Authorizing Provider  amLODipine (NORVASC) 2.5 MG tablet Take 2.5 mg by mouth daily.   Yes Historical Provider, MD  aspirin 81 MG chewable tablet Chew 81 mg by mouth once a week.    Yes Historical Provider, MD  Calcium Carbonate-Vit D-Min (CALTRATE PLUS PO) Take 1 tablet by mouth 2 (two) times daily.    Yes Historical Provider,  MD  fluticasone (FLONASE) 50 MCG/ACT nasal spray Place 1 spray into both nostrils daily as needed for allergies or rhinitis.   Yes Historical Provider, MD  levothyroxine (SYNTHROID, LEVOTHROID) 100 MCG tablet Take 100 mcg by mouth daily before breakfast.   Yes Historical Provider, MD  multivitamin (THERAGRAN) per tablet Take 1 tablet by mouth daily.     Yes Historical Provider, MD  naproxen (NAPROSYN) 500 MG tablet take 1 tablet by mouth every 12 hours if needed for pain with food 11/17/14  Yes Historical Provider, MD   BP 135/81 mmHg  Pulse 70  Temp(Src) 98.2 F (36.8 C) (Oral)  Resp 18  SpO2 97% Physical Exam  Constitutional: She is oriented to person, place, and time. She appears well-developed and well-nourished. No distress.  HENT:  Head: Normocephalic and atraumatic.  Right Ear: Hearing normal.  Left Ear: Hearing normal.  Nose: Nose normal.  Mouth/Throat: Oropharynx is clear and  moist and mucous membranes are normal.  Eyes: Conjunctivae and EOM are normal. Pupils are equal, round, and reactive to light.  Neck: Normal range of motion. Neck supple.  Cardiovascular: Regular rhythm, S1 normal and S2 normal.  Exam reveals no gallop and no friction rub.   No murmur heard. Pulmonary/Chest: Effort normal and breath sounds normal. No respiratory distress. She exhibits no tenderness.  Abdominal: Soft. Normal appearance and bowel sounds are normal. There is no hepatosplenomegaly. There is no tenderness. There is no rebound, no guarding, no tenderness at McBurney's point and negative Murphy's sign. No hernia.  Musculoskeletal: Normal range of motion.  Neurological: She is alert and oriented to person, place, and time. She has normal strength. No cranial nerve deficit or sensory deficit. Coordination normal. GCS eye subscore is 4. GCS verbal subscore is 5. GCS motor subscore is 6.  Skin: Skin is warm, dry and intact. No rash noted. No cyanosis.  Psychiatric: She has a normal mood and affect. Her speech is normal and behavior is normal. Thought content normal.  Nursing note and vitals reviewed.   ED Course  Procedures  DIAGNOSTIC STUDIES: Oxygen Saturation is 96% on RA, adequate by my interpretation.    COORDINATION OF CARE: 3:21 AM - Discussed lab and XR results, as well as plans to order further diagnostic studies to check thyroid values. Pt advised of plan for treatment and pt agrees.  Labs Review Labs Reviewed  BASIC METABOLIC PANEL - Abnormal; Notable for the following:    Glucose, Bld 150 (*)    Creatinine, Ser 1.02 (*)    GFR calc non Af Amer 54 (*)    All other components within normal limits  CBC - Abnormal; Notable for the following:    WBC 10.7 (*)    All other components within normal limits  TROPONIN I  TSH  T3, FREE  T4, FREE    Imaging Review Dg Chest 2 View  04/02/2015  CLINICAL DATA:  Acute onset of palpitations and high blood pressure. Initial  encounter. EXAM: CHEST  2 VIEW COMPARISON:  Chest radiograph performed 09/18/2008 FINDINGS: The lungs are well-aerated and clear. There is no evidence of focal opacification, pleural effusion or pneumothorax. The heart is normal in size; the mediastinal contour is within normal limits. No acute osseous abnormalities are seen. Clips are noted within the right upper quadrant, reflecting prior cholecystectomy. IMPRESSION: No acute cardiopulmonary process seen. Electronically Signed   By: Garald Balding M.D.   On: 04/02/2015 00:55   I have personally reviewed and evaluated these images and  lab results as part of my medical decision-making.   EKG Interpretation   Date/Time:  Sunday April 01 2015 23:56:02 EST Ventricular Rate:  102 PR Interval:  178 QRS Duration: 84 QT Interval:  352 QTC Calculation: 458 R Axis:   -21 Text Interpretation:  Sinus tachycardia with occasional Premature  ventricular complexes Possible Left atrial enlargement Left ventricular  hypertrophy Cannot rule out Septal infarct , age undetermined Abnormal ECG  Confirmed by Kent Braunschweig  MD, Miran Kautzman [id number]) on 04/02/2015 3:08:59 AM      MDM   Final diagnoses:  Essential hypertension  Palpitations    As for evaluation of heart palpitations and elevated blood pressure. Patient reports that she was washing TV when she felt her heart fluttering and racing. She reports that her heart rate went up to nearly 120 bpm. She reports that it would fluctuate up and down for approximately 30 minutes. Symptoms have now resolved. When she was experiencing the palpitations, she took her blood pressure and it was markedly elevated. She does have a history of hypertension. She takes amlodipine, has had her dosing decreased in the past, however, for low blood pressures. Patient is extremely anxious here in the ER. This is likely elevating her blood pressure. I did not recommend changing her dosing at this time. She will check her blood pressure  daily and follow-up with her cardiologist, Dr. Tamala Julian. Patient has not had any arrhythmias noted in the ER. At time of follow-up with cardiology, to consider outpatient monitoring if palpitations continue.   I personally performed the services described in this documentation, which was scribed in my presence. The recorded information has been reviewed and is accurate.   Orpah Greek, MD 04/02/15 [phone]

## 2015-04-02 NOTE — Discharge Instructions (Signed)
Hypertension Hypertension is another name for high blood pressure. High blood pressure forces your heart to work harder to pump blood. A blood pressure reading has two numbers, which includes a higher number over a lower number (example: 110/72). HOME CARE   Have your blood pressure rechecked by your doctor.  Only take medicine as told by your doctor. Follow the directions carefully. The medicine does not work as well if you skip doses. Skipping doses also puts you at risk for problems.  Do not smoke.  Monitor your blood pressure at home as told by your doctor. GET HELP IF:  You think you are having a reaction to the medicine you are taking.  You have repeat headaches or feel dizzy.  You have puffiness (swelling) in your ankles.  You have trouble with your vision. GET HELP RIGHT AWAY IF:   You get a very bad headache and are confused.  You feel weak, numb, or faint.  You get chest or belly (abdominal) pain.  You throw up (vomit).  You cannot breathe very well. MAKE SURE YOU:   Understand these instructions.  Will watch your condition.  Will get help right away if you are not doing well or get worse.   This information is not intended to replace advice given to you by your health care provider. Make sure you discuss any questions you have with your health care provider.   Document Released: 07/30/2007 Document Revised: 02/15/2013 Document Reviewed: 12/03/2012 Elsevier Interactive Patient Education 2016 [employer].  Palpitations A palpitation is the feeling that your heartbeat is irregular or is faster than normal. It may feel like your heart is fluttering or skipping a beat. Palpitations are usually not a serious problem. However, in some cases, you may need further medical evaluation. CAUSES  Palpitations can be caused by:  Smoking.  Caffeine or other stimulants, such as diet pills or energy drinks.  Alcohol.  Stress and anxiety.  Strenuous physical  activity.  Fatigue.  Certain medicines.  Heart disease, especially if you have a history of irregular heart rhythms (arrhythmias), such as atrial fibrillation, atrial flutter, or supraventricular tachycardia.  An improperly working pacemaker or defibrillator. DIAGNOSIS  To find the cause of your palpitations, your health care provider will take your medical history and perform a physical exam. Your health care provider may also have you take a test called an ambulatory electrocardiogram (ECG). An ECG records your heartbeat patterns over a 24-hour period. You may also have other tests, such as:  Transthoracic echocardiogram (TTE). During echocardiography, sound waves are used to evaluate how blood flows through your heart.  Transesophageal echocardiogram (TEE).  Cardiac monitoring. This allows your health care provider to monitor your heart rate and rhythm in real time.  Holter monitor. This is a portable device that records your heartbeat and can help diagnose heart arrhythmias. It allows your health care provider to track your heart activity for several days, if needed.  Stress tests by exercise or by giving medicine that makes the heart beat faster. TREATMENT  Treatment of palpitations depends on the cause of your symptoms and can vary greatly. Most cases of palpitations do not require any treatment other than time, relaxation, and monitoring your symptoms. Other causes, such as atrial fibrillation, atrial flutter, or supraventricular tachycardia, usually require further treatment. HOME CARE INSTRUCTIONS   Avoid:  Caffeinated coffee, tea, soft drinks, diet pills, and energy drinks.  Chocolate.  Alcohol.  Stop smoking if you smoke.  Reduce your stress and anxiety.  Things that can help you relax include:  A method of controlling things in your body, such as your heartbeats, with your mind (biofeedback).  Yoga.  Meditation.  Physical activity such as swimming, jogging, or  walking.  Get plenty of rest and sleep. SEEK MEDICAL CARE IF:   You continue to have a fast or irregular heartbeat beyond 24 hours.  Your palpitations occur more often. SEEK IMMEDIATE MEDICAL CARE IF:  You have chest pain or shortness of breath.  You have a severe headache.  You feel dizzy or you faint. MAKE SURE YOU:  Understand these instructions.  Will watch your condition.  Will get help right away if you are not doing well or get worse.   This information is not intended to replace advice given to you by your health care provider. Make sure you discuss any questions you have with your health care provider.   Document Released: 02/08/2000 Document Revised: 02/15/2013 Document Reviewed: 04/11/2011 Elsevier Interactive Patient Education [employer].

## 2015-04-03 DIAGNOSIS — I10 Essential (primary) hypertension: Secondary | ICD-10-CM | POA: Diagnosis not present

## 2015-04-03 DIAGNOSIS — R002 Palpitations: Secondary | ICD-10-CM | POA: Diagnosis not present

## 2015-04-03 LAB — T3, FREE: T3, Free: 2.6 pg/mL (ref 2.0–4.4)

## 2015-04-04 ENCOUNTER — Telehealth: Payer: Self-pay

## 2015-04-04 DIAGNOSIS — R000 Tachycardia, unspecified: Secondary | ICD-10-CM

## 2015-04-04 DIAGNOSIS — R002 Palpitations: Secondary | ICD-10-CM

## 2015-04-04 NOTE — Telephone Encounter (Signed)
-----   Message from Rosalyn Gess sent at 04/04/2015  2:06 PM EST ----- Hello  This patient called today and stated she spoke personally w/ Dr Tamala Julian on the phone yesterday (2/7), concerning her hospital stay and stated that Dr Tamala Julian wanted her to follow up with a heart monitor. There's no telephone note or order in the system. Let me know if can do anything. Thank you   Lorin Mercy

## 2015-04-04 NOTE — Telephone Encounter (Signed)
Per Verbal Order pt needs 30 day event monitor for palpitations and tachycardia; orders in system. Left message for pt to call and schedule monitor.

## 2015-04-05 NOTE — Telephone Encounter (Signed)
Follow up   Pt is returning rn call

## 2015-04-05 NOTE — Telephone Encounter (Signed)
Event monitor scheduled for 2/13.

## 2015-04-09 ENCOUNTER — Ambulatory Visit (INDEPENDENT_AMBULATORY_CARE_PROVIDER_SITE_OTHER): Payer: Medicare Other

## 2015-04-09 DIAGNOSIS — R000 Tachycardia, unspecified: Secondary | ICD-10-CM | POA: Diagnosis not present

## 2015-04-09 DIAGNOSIS — R002 Palpitations: Secondary | ICD-10-CM | POA: Diagnosis not present

## 2015-05-01 DIAGNOSIS — R319 Hematuria, unspecified: Secondary | ICD-10-CM | POA: Diagnosis not present

## 2015-05-01 DIAGNOSIS — N200 Calculus of kidney: Secondary | ICD-10-CM | POA: Diagnosis not present

## 2015-05-04 DIAGNOSIS — R319 Hematuria, unspecified: Secondary | ICD-10-CM | POA: Insufficient documentation

## 2015-05-15 DIAGNOSIS — E039 Hypothyroidism, unspecified: Secondary | ICD-10-CM | POA: Diagnosis not present

## 2015-05-15 DIAGNOSIS — M810 Age-related osteoporosis without current pathological fracture: Secondary | ICD-10-CM | POA: Diagnosis not present

## 2015-05-15 DIAGNOSIS — I10 Essential (primary) hypertension: Secondary | ICD-10-CM | POA: Diagnosis not present

## 2015-05-15 DIAGNOSIS — N200 Calculus of kidney: Secondary | ICD-10-CM | POA: Diagnosis not present

## 2015-05-15 DIAGNOSIS — K5730 Diverticulosis of large intestine without perforation or abscess without bleeding: Secondary | ICD-10-CM | POA: Diagnosis not present

## 2015-05-15 DIAGNOSIS — R319 Hematuria, unspecified: Secondary | ICD-10-CM | POA: Diagnosis not present

## 2015-05-15 DIAGNOSIS — E782 Mixed hyperlipidemia: Secondary | ICD-10-CM | POA: Diagnosis not present

## 2015-05-15 DIAGNOSIS — N2889 Other specified disorders of kidney and ureter: Secondary | ICD-10-CM | POA: Diagnosis not present

## 2015-07-09 DIAGNOSIS — H2513 Age-related nuclear cataract, bilateral: Secondary | ICD-10-CM | POA: Diagnosis not present

## 2015-08-29 ENCOUNTER — Encounter: Payer: Self-pay | Admitting: Gynecology

## 2015-08-29 ENCOUNTER — Ambulatory Visit (INDEPENDENT_AMBULATORY_CARE_PROVIDER_SITE_OTHER): Payer: Medicare Other | Admitting: Gynecology

## 2015-08-29 ENCOUNTER — Telehealth: Payer: Self-pay | Admitting: *Deleted

## 2015-08-29 VITALS — BP 136/80 | Ht 63.0 in | Wt 166.0 lb

## 2015-08-29 DIAGNOSIS — N6320 Unspecified lump in the left breast, unspecified quadrant: Secondary | ICD-10-CM

## 2015-08-29 DIAGNOSIS — N63 Unspecified lump in unspecified breast: Secondary | ICD-10-CM

## 2015-08-29 NOTE — Telephone Encounter (Signed)
Orders placed they will contact pt to schedule. 

## 2015-08-29 NOTE — Patient Instructions (Signed)
The breast center will call you to arrange for the mammogram and ultrasound. Call my office if you do not hear from them within 1 week.

## 2015-08-29 NOTE — Telephone Encounter (Signed)
-----   Message from Anastasio Auerbach, MD sent at 08/29/2015  2:34 PM EDT ----- Schedule diagnostic mammography and ultrasound left breast reference new onset patient proceed thickening left tail of Spence. Physician exam is normal.

## 2015-08-29 NOTE — Progress Notes (Signed)
    Lynn Collins 08-Aug-1945 [id number]        69 y.o.  [id number] presents with a one-week history of palpable fullness in the lateral portion of the left breast. Notes some tingling for about 1 month preceding around this area and then she feels as if this tissue feels thicker. No tenderness now. No nipple discharge. Last mammogram 11/2014.  Past medical history,surgical history, problem list, medications, allergies, family history and social history were all reviewed and documented in the EPIC chart.  Directed ROS with pertinent positives and negatives documented in the history of present illness/assessment and plan.  Exam: Wandra Scot assistant Filed Vitals:   08/29/15 1408  BP: 136/80  Height: 5\' 3"  (1.6 m)  Weight: 166 lb (75.297 kg)   General appearance:  Normal Both breast examined lying and sitting. No masses, retractions, discharge, adenopathy. The area the patient's pointing to is the lateral tail of Spence left breast.  Assessment/Plan:  70 y.o. [id number] with perceived thickening left tail of Spence region. Exam is normal to the physician. Recommend diagnostic mammography/ultrasound left breast. If both studies negative patient will follow with self breast exams as long as this area remains stable or resolves them will follow. If changes or enlarges will follow up for further evaluation.  If either test abnormal then will triage based upon results.  Patient knows to call my office if she does not hear from the breast center within the week to schedule.    Anastasio Auerbach MD, 2:31 PM 08/29/2015

## 2015-09-04 NOTE — Telephone Encounter (Signed)
Appointment on 09/07/15 @ 1:20pm at breast center left message for pt to call

## 2015-09-05 NOTE — Telephone Encounter (Signed)
Pt informed with the below. 

## 2015-09-07 ENCOUNTER — Ambulatory Visit
Admission: RE | Admit: 2015-09-07 | Discharge: 2015-09-07 | Disposition: A | Discharge status: Home / Self Care | Payer: Medicare Other | Source: Ambulatory Visit | Attending: Gynecology | Admitting: Gynecology

## 2015-09-07 DIAGNOSIS — N6320 Unspecified lump in the left breast, unspecified quadrant: Secondary | ICD-10-CM

## 2015-09-07 DIAGNOSIS — R922 Inconclusive mammogram: Secondary | ICD-10-CM | POA: Diagnosis not present

## 2015-09-07 DIAGNOSIS — N6489 Other specified disorders of breast: Secondary | ICD-10-CM | POA: Diagnosis not present

## 2015-09-27 ENCOUNTER — Ambulatory Visit (INDEPENDENT_AMBULATORY_CARE_PROVIDER_SITE_OTHER): Payer: Medicare Other | Admitting: Gynecology

## 2015-09-27 ENCOUNTER — Encounter: Payer: Self-pay | Admitting: Gynecology

## 2015-09-27 VITALS — BP 122/74 | Ht 63.0 in | Wt 164.0 lb

## 2015-09-27 DIAGNOSIS — M8580 Other specified disorders of bone density and structure, unspecified site: Secondary | ICD-10-CM

## 2015-09-27 DIAGNOSIS — N816 Rectocele: Secondary | ICD-10-CM

## 2015-09-27 DIAGNOSIS — R9230 Dense breasts, unspecified: Secondary | ICD-10-CM

## 2015-09-27 DIAGNOSIS — D271 Benign neoplasm of left ovary: Secondary | ICD-10-CM | POA: Diagnosis not present

## 2015-09-27 DIAGNOSIS — R922 Inconclusive mammogram: Secondary | ICD-10-CM | POA: Diagnosis not present

## 2015-09-27 DIAGNOSIS — N952 Postmenopausal atrophic vaginitis: Secondary | ICD-10-CM | POA: Diagnosis not present

## 2015-09-27 DIAGNOSIS — Z01419 Encounter for gynecological examination (general) (routine) without abnormal findings: Secondary | ICD-10-CM

## 2015-09-27 NOTE — Progress Notes (Signed)
Lynn Collins [birth year] [id number]        69 y.o.  [id number]  for breast and pelvic exam. Several issues noted below.  Past medical history,surgical history, problem list, medications, allergies, family history and social history were all reviewed and documented as reviewed in the EPIC chart.  ROS:  Performed with pertinent positives and negatives included in the history, assessment and plan.   Additional significant findings :  None   Exam: [name] assistant Vitals:   09/27/15 1049  BP: 122/74  Weight: 164 lb (74.4 kg)  Height: 5\' 3"  (1.6 m)   Body mass index is 29.05 kg/m.  General appearance:  Normal affect, orientation and appearance. Skin: Grossly normal HEENT: Without gross lesions.  No cervical or supraclavicular adenopathy. Thyroid normal.  Lungs:  Clear without wheezing, rales or rhonchi Cardiac: RR, without RMG Abdominal:  Soft, nontender, without masses, guarding, rebound, organomegaly or hernia Breasts:  Examined lying and sitting without masses, retractions, discharge or axillary adenopathy. Pelvic:  Ext/BUS/Vagina with atrophic changes. Second-degree rectocele noted. Mild cystocele. Cuff well supported  Cervix with atrophic changes  Uterus anteverted, normal size, shape and contour, midline and mobile nontender   Adnexa without masses or tenderness    Anus and perineum normal   Rectovaginal normal sphincter tone without palpated masses or tenderness.    Assessment/Plan:  70 y.o. [id number] female for breast and pelvic exam.   1. Postmenopausal/atrophic genital changes. No significant hot flushes, night sweats, vaginal dryness or any vaginal bleeding. Occasionally uses Estrace vaginal cream for vaginal dryness. We have discussed the issues of absorption and risks of systemic effects such as stroke heart attack DVT breast cancer and endometrial stimulation. Does not require any prescription now. 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 I reviewed Dr. Elenora Gamma October 2016 note. She has been stable over serial ultrasounds and Dr. Elenora Gamma recommendation was to release her to routine follow up. I discussed this with the patient and certainly is reasonable given the situation and the lack of change in the area that was seen and she is comfortable with no further studies recognize we do not have a pathologic diagnoses. 3. Rectocele. Overall stable from prior exam. We discussed again options and as long as she continues to have regular softer bowel movements this is not an issue and she prefers expectant management. Will follow up if issues arise. 4. Left breast density tail of Spence.  Area was perceived patient early July 2017. My exam was normal at that point. Ultrasound and diagnostic mammography showed normal-appearing breast tissue. Area has remained stable to her self-exam. Area is normal to my exam today. I reviewed with her my recommendations that we follow this area closely with self breast exams. As long as she feels no changes as far as increasing density or definitive mass is that we will follow expectantly and she will get her next mammogram when due for her annual screening. If she feels any changes in the areas then she will follow up with me for further evaluation. Patient's comfortable with this plan.  5. Osteopenia. DEXA 10/2013 T score -1.4 FRAX 15%/2.8%. Plan repeat DEXA this coming year. Patient knows to call me if she does not hear going into 2018 from the radiology department to schedule this. 6. Pap smear 2015. No Pap smear done today. No history of significant abnormal Pap smears previously. I reviewed current screening guidelines and options to stop screening based on age reviewed. Will  readdress on an annual basis. 7. Colonoscopy 2014. Repeat at their recommended interval. 8. Health maintenance. No routine lab work done as this is done elsewhere. Follow up 1 year, sooner as needed.    Greater than 15 minutes of my time in excess of her breast and pelvic exam was spent in direct face to face counseling and coordination of care in regards to her problems of breast density, ovarian fibroma, osteopenia and rectocele.   Anastasio Auerbach MD, 11:15 AM 09/27/2015

## 2015-09-27 NOTE — Patient Instructions (Signed)

## 2015-10-10 DIAGNOSIS — E782 Mixed hyperlipidemia: Secondary | ICD-10-CM | POA: Diagnosis not present

## 2015-10-10 DIAGNOSIS — Z7982 Long term (current) use of aspirin: Secondary | ICD-10-CM | POA: Diagnosis not present

## 2015-10-10 DIAGNOSIS — Z808 Family history of malignant neoplasm of other organs or systems: Secondary | ICD-10-CM | POA: Diagnosis not present

## 2015-10-10 DIAGNOSIS — I10 Essential (primary) hypertension: Secondary | ICD-10-CM | POA: Diagnosis not present

## 2015-10-10 DIAGNOSIS — E042 Nontoxic multinodular goiter: Secondary | ICD-10-CM | POA: Diagnosis not present

## 2015-10-10 DIAGNOSIS — E039 Hypothyroidism, unspecified: Secondary | ICD-10-CM | POA: Diagnosis not present

## 2015-12-07 DIAGNOSIS — E782 Mixed hyperlipidemia: Secondary | ICD-10-CM | POA: Diagnosis not present

## 2015-12-07 DIAGNOSIS — I10 Essential (primary) hypertension: Secondary | ICD-10-CM | POA: Diagnosis not present

## 2015-12-07 DIAGNOSIS — Z23 Encounter for immunization: Secondary | ICD-10-CM | POA: Diagnosis not present

## 2015-12-07 DIAGNOSIS — Z0000 Encounter for general adult medical examination without abnormal findings: Secondary | ICD-10-CM | POA: Diagnosis not present

## 2015-12-13 ENCOUNTER — Other Ambulatory Visit: Payer: Self-pay | Admitting: Gynecology

## 2015-12-13 DIAGNOSIS — M47812 Spondylosis without myelopathy or radiculopathy, cervical region: Secondary | ICD-10-CM | POA: Diagnosis not present

## 2015-12-13 DIAGNOSIS — Z85850 Personal history of malignant neoplasm of thyroid: Secondary | ICD-10-CM | POA: Diagnosis not present

## 2015-12-13 DIAGNOSIS — E042 Nontoxic multinodular goiter: Secondary | ICD-10-CM | POA: Diagnosis not present

## 2015-12-13 DIAGNOSIS — N6320 Unspecified lump in the left breast, unspecified quadrant: Secondary | ICD-10-CM

## 2016-01-04 ENCOUNTER — Ambulatory Visit
Admission: RE | Admit: 2016-01-04 | Discharge: 2016-01-04 | Disposition: A | Discharge status: Home / Self Care | Payer: Medicare Other | Source: Ambulatory Visit | Attending: Gynecology | Admitting: Gynecology

## 2016-01-04 DIAGNOSIS — N644 Mastodynia: Secondary | ICD-10-CM | POA: Diagnosis not present

## 2016-01-04 DIAGNOSIS — N6320 Unspecified lump in the left breast, unspecified quadrant: Secondary | ICD-10-CM

## 2016-01-10 DIAGNOSIS — E041 Nontoxic single thyroid nodule: Secondary | ICD-10-CM | POA: Diagnosis not present

## 2016-01-10 DIAGNOSIS — E063 Autoimmune thyroiditis: Secondary | ICD-10-CM | POA: Diagnosis not present

## 2016-01-10 DIAGNOSIS — E042 Nontoxic multinodular goiter: Secondary | ICD-10-CM | POA: Diagnosis not present

## 2016-01-10 DIAGNOSIS — E039 Hypothyroidism, unspecified: Secondary | ICD-10-CM | POA: Diagnosis not present

## 2016-01-28 DIAGNOSIS — R509 Fever, unspecified: Secondary | ICD-10-CM | POA: Diagnosis not present

## 2016-01-28 DIAGNOSIS — J320 Chronic maxillary sinusitis: Secondary | ICD-10-CM | POA: Diagnosis not present

## 2016-01-28 DIAGNOSIS — J029 Acute pharyngitis, unspecified: Secondary | ICD-10-CM | POA: Diagnosis not present

## 2016-01-28 DIAGNOSIS — J988 Other specified respiratory disorders: Secondary | ICD-10-CM | POA: Diagnosis not present

## 2016-03-04 DIAGNOSIS — R040 Epistaxis: Secondary | ICD-10-CM | POA: Diagnosis not present

## 2016-04-11 DIAGNOSIS — E041 Nontoxic single thyroid nodule: Secondary | ICD-10-CM | POA: Diagnosis not present

## 2016-04-11 DIAGNOSIS — E042 Nontoxic multinodular goiter: Secondary | ICD-10-CM | POA: Diagnosis not present

## 2016-04-25 DIAGNOSIS — E039 Hypothyroidism, unspecified: Secondary | ICD-10-CM | POA: Diagnosis not present

## 2016-04-25 DIAGNOSIS — E042 Nontoxic multinodular goiter: Secondary | ICD-10-CM | POA: Diagnosis not present

## 2016-04-25 DIAGNOSIS — K648 Other hemorrhoids: Secondary | ICD-10-CM | POA: Insufficient documentation

## 2016-04-25 DIAGNOSIS — J320 Chronic maxillary sinusitis: Secondary | ICD-10-CM | POA: Insufficient documentation

## 2016-05-13 DIAGNOSIS — N200 Calculus of kidney: Secondary | ICD-10-CM | POA: Diagnosis not present

## 2016-05-13 DIAGNOSIS — I878 Other specified disorders of veins: Secondary | ICD-10-CM | POA: Diagnosis not present

## 2016-05-13 DIAGNOSIS — N2889 Other specified disorders of kidney and ureter: Secondary | ICD-10-CM | POA: Diagnosis not present

## 2016-05-31 DIAGNOSIS — M5416 Radiculopathy, lumbar region: Secondary | ICD-10-CM | POA: Diagnosis not present

## 2016-05-31 DIAGNOSIS — M5432 Sciatica, left side: Secondary | ICD-10-CM | POA: Diagnosis not present

## 2016-06-02 DIAGNOSIS — M5416 Radiculopathy, lumbar region: Secondary | ICD-10-CM | POA: Diagnosis not present

## 2016-06-13 DIAGNOSIS — M1712 Unilateral primary osteoarthritis, left knee: Secondary | ICD-10-CM | POA: Diagnosis not present

## 2016-06-17 DIAGNOSIS — J069 Acute upper respiratory infection, unspecified: Secondary | ICD-10-CM | POA: Diagnosis not present

## 2016-06-17 DIAGNOSIS — R05 Cough: Secondary | ICD-10-CM | POA: Diagnosis not present

## 2016-06-24 DIAGNOSIS — R194 Change in bowel habit: Secondary | ICD-10-CM | POA: Diagnosis not present

## 2016-07-08 DIAGNOSIS — M25562 Pain in left knee: Secondary | ICD-10-CM | POA: Diagnosis not present

## 2016-07-28 DIAGNOSIS — M545 Low back pain: Secondary | ICD-10-CM | POA: Diagnosis not present

## 2016-07-28 DIAGNOSIS — M25562 Pain in left knee: Secondary | ICD-10-CM | POA: Diagnosis not present

## 2016-07-28 DIAGNOSIS — M1712 Unilateral primary osteoarthritis, left knee: Secondary | ICD-10-CM | POA: Diagnosis not present

## 2016-08-08 DIAGNOSIS — Z7982 Long term (current) use of aspirin: Secondary | ICD-10-CM | POA: Diagnosis not present

## 2016-08-08 DIAGNOSIS — Z91040 Latex allergy status: Secondary | ICD-10-CM | POA: Diagnosis not present

## 2016-08-08 DIAGNOSIS — E782 Mixed hyperlipidemia: Secondary | ICD-10-CM | POA: Diagnosis not present

## 2016-08-08 DIAGNOSIS — Z8349 Family history of other endocrine, nutritional and metabolic diseases: Secondary | ICD-10-CM | POA: Diagnosis not present

## 2016-08-08 DIAGNOSIS — Z888 Allergy status to other drugs, medicaments and biological substances status: Secondary | ICD-10-CM | POA: Diagnosis not present

## 2016-08-08 DIAGNOSIS — E042 Nontoxic multinodular goiter: Secondary | ICD-10-CM | POA: Diagnosis not present

## 2016-08-08 DIAGNOSIS — Z808 Family history of malignant neoplasm of other organs or systems: Secondary | ICD-10-CM | POA: Diagnosis not present

## 2016-08-08 DIAGNOSIS — Z833 Family history of diabetes mellitus: Secondary | ICD-10-CM | POA: Diagnosis not present

## 2016-08-08 DIAGNOSIS — E039 Hypothyroidism, unspecified: Secondary | ICD-10-CM | POA: Diagnosis not present

## 2016-08-08 DIAGNOSIS — Z882 Allergy status to sulfonamides status: Secondary | ICD-10-CM | POA: Diagnosis not present

## 2016-08-08 DIAGNOSIS — I10 Essential (primary) hypertension: Secondary | ICD-10-CM | POA: Diagnosis not present

## 2016-08-08 DIAGNOSIS — Z841 Family history of disorders of kidney and ureter: Secondary | ICD-10-CM | POA: Diagnosis not present

## 2016-08-08 DIAGNOSIS — E041 Nontoxic single thyroid nodule: Secondary | ICD-10-CM | POA: Diagnosis not present

## 2016-08-08 DIAGNOSIS — Z87442 Personal history of urinary calculi: Secondary | ICD-10-CM | POA: Diagnosis not present

## 2016-08-08 DIAGNOSIS — Z79899 Other long term (current) drug therapy: Secondary | ICD-10-CM | POA: Diagnosis not present

## 2016-08-08 DIAGNOSIS — M8580 Other specified disorders of bone density and structure, unspecified site: Secondary | ICD-10-CM | POA: Diagnosis not present

## 2016-10-02 ENCOUNTER — Ambulatory Visit (INDEPENDENT_AMBULATORY_CARE_PROVIDER_SITE_OTHER): Payer: Medicare Other | Admitting: Gynecology

## 2016-10-02 ENCOUNTER — Encounter: Payer: Self-pay | Admitting: Gynecology

## 2016-10-02 VITALS — BP 124/80 | Ht 63.0 in | Wt 168.0 lb

## 2016-10-02 DIAGNOSIS — N952 Postmenopausal atrophic vaginitis: Secondary | ICD-10-CM

## 2016-10-02 DIAGNOSIS — Z124 Encounter for screening for malignant neoplasm of cervix: Secondary | ICD-10-CM | POA: Diagnosis not present

## 2016-10-02 DIAGNOSIS — Z01411 Encounter for gynecological examination (general) (routine) with abnormal findings: Secondary | ICD-10-CM | POA: Diagnosis not present

## 2016-10-02 DIAGNOSIS — M8580 Other specified disorders of bone density and structure, unspecified site: Secondary | ICD-10-CM

## 2016-10-02 DIAGNOSIS — D279 Benign neoplasm of unspecified ovary: Secondary | ICD-10-CM

## 2016-10-02 DIAGNOSIS — N816 Rectocele: Secondary | ICD-10-CM

## 2016-10-02 NOTE — Addendum Note (Signed)
Addended by: Nelva Nay on: 10/02/2016 10:25 AM   Modules accepted: Orders

## 2016-10-02 NOTE — Progress Notes (Signed)
    Lynn Collins [birth year] [id number]        70 y.o.  [id number] for breast and pelvic exam.  Past medical history,surgical history, problem list, medications, allergies, family history and social history were all reviewed and documented as reviewed in the EPIC chart.  ROS:  Performed with pertinent positives and negatives included in the history, assessment and plan.   Additional significant findings :  None   Exam: [name] assistant Vitals:   10/02/16 0942  BP: 124/80  Weight: 168 lb (76.2 kg)  Height: 5\' 3"  (1.6 m)   Body mass index is 29.76 kg/m.  General appearance:  Normal affect, orientation and appearance. Skin: Grossly normal HEENT: Without gross lesions.  No cervical or supraclavicular adenopathy. Thyroid normal.  Lungs:  Clear without wheezing, rales or rhonchi Cardiac: RR, without RMG Abdominal:  Soft, nontender, without masses, guarding, rebound, organomegaly or hernia Breasts:  Examined lying and sitting without masses, retractions, discharge or axillary adenopathy. Pelvic:  Ext, BUS, Vagina: With atrophic changes. Second-degree rectocele noted  Cervix: With atrophic changes. Pap smear done  Uterus: Anteverted, normal size, shape and contour, midline and mobile nontender   Adnexa: Without masses or tenderness    Anus and perineum: Normal   Rectovaginal: Normal sphincter tone without palpated masses or tenderness. Rectocele confirmed   Assessment/Plan:  71 y.o. [id number] female for breast and pelvic exam.   1. Postmenopausal/atrophic genital changes. Had been using Estrace cream occasionally but has discontinued this and is doing well without vaginal dryness or irritation. No bleeding. Continue to monitor report any issues or bleeding. 2. History of ovarian fibroma. Had been followed with serial ultrasounds which remained stable. Saw Dr. Alycia Rossetti in consultation who released her to routine follow up.  Exam today is normal. Patient comfortable with no further  screening. 3. Mammography 12/2015. Continue with annual mammography when due. Breast exam normal today. Self breast awareness reviewed. 4. Rectocele. Stable on serial exams. No significant symptoms. Continue to monitor report any issues. 5. Osteopenia. DEXA 2015 T score -1.4 FRAX 15%/2.8%. Recommend repeat DEXA now. She'll schedule this at the breast center when she does her mammography this November. 6. Pap smear 2015. Pap smear done today. No history of significant abnormal Pap smears. Options to stop screening per current screening guidelines reviewed. Will readdress on annual basis. 7. Colonoscopy 2014. Repeat at their recommended interval. 8. Health maintenance. No routine lab work done as patient does this elsewhere. Follow up 1 year, sooner as needed.   Anastasio Auerbach MD, 10:16 AM 10/02/2016

## 2016-10-02 NOTE — Patient Instructions (Signed)
Health Maintenance for Postmenopausal Women Menopause is a normal process in which your reproductive ability comes to an end. This process happens gradually over a span of months to years, usually between the ages of 22 and 9. Menopause is complete when you have missed 12 consecutive menstrual periods. It is important to talk with your health care provider about some of the most common conditions that affect postmenopausal women, such as heart disease, cancer, and bone loss (osteoporosis). Adopting a healthy lifestyle and getting preventive care can help to promote your health and wellness. Those actions can also lower your chances of developing some of these common conditions. What should I know about menopause? During menopause, you may experience a number of symptoms, such as:  Moderate-to-severe hot flashes.  Night sweats.  Decrease in sex drive.  Mood swings.  Headaches.  Tiredness.  Irritability.  Memory problems.  Insomnia.  Choosing to treat or not to treat menopausal changes is an individual decision that you make with your health care provider. What should I know about hormone replacement therapy and supplements? Hormone therapy products are effective for treating symptoms that are associated with menopause, such as hot flashes and night sweats. Hormone replacement carries certain risks, especially as you become older. If you are thinking about using estrogen or estrogen with progestin treatments, discuss the benefits and risks with your health care provider. What should I know about heart disease and stroke? Heart disease, heart attack, and stroke become more likely as you age. This may be due, in part, to the hormonal changes that your body experiences during menopause. These can affect how your body processes dietary fats, triglycerides, and cholesterol. Heart attack and stroke are both medical emergencies. There are many things that you can do to help prevent heart disease  and stroke:  Have your blood pressure checked at least every 1-2 years. High blood pressure causes heart disease and increases the risk of stroke.  If you are 53-22 years old, ask your health care provider if you should take aspirin to prevent a heart attack or a stroke.  Do not use any tobacco products, including cigarettes, chewing tobacco, or electronic cigarettes. If you need help quitting, ask your health care provider.  It is important to eat a healthy diet and maintain a healthy weight. ? Be sure to include plenty of vegetables, fruits, low-fat dairy products, and lean protein. ? Avoid eating foods that are high in solid fats, added sugars, or salt (sodium).  Get regular exercise. This is one of the most important things that you can do for your health. ? Try to exercise for at least 150 minutes each week. The type of exercise that you do should increase your heart rate and make you sweat. This is known as moderate-intensity exercise. ? Try to do strengthening exercises at least twice each week. Do these in addition to the moderate-intensity exercise.  Know your numbers.Ask your health care provider to check your cholesterol and your blood glucose. Continue to have your blood tested as directed by your health care provider.  What should I know about cancer screening? There are several types of cancer. Take the following steps to reduce your risk and to catch any cancer development as early as possible. Breast Cancer  Practice breast self-awareness. ? This means understanding how your breasts normally appear and feel. ? It also means doing regular breast self-exams. Let your health care provider know about any changes, no matter how small.  If you are 40  or older, have a clinician do a breast exam (clinical breast exam or CBE) every year. Depending on your age, family history, and medical history, it may be recommended that you also have a yearly breast X-ray (mammogram).  If you  have a family history of breast cancer, talk with your health care provider about genetic screening.  If you are at high risk for breast cancer, talk with your health care provider about having an MRI and a mammogram every year.  Breast cancer (BRCA) gene test is recommended for women who have family members with BRCA-related cancers. Results of the assessment will determine the need for genetic counseling and BRCA1 and for BRCA2 testing. BRCA-related cancers include these types: ? Breast. This occurs in males or females. ? Ovarian. ? Tubal. This may also be called fallopian tube cancer. ? Cancer of the abdominal or pelvic lining (peritoneal cancer). ? Prostate. ? Pancreatic.  Cervical, Uterine, and Ovarian Cancer Your health care provider may recommend that you be screened regularly for cancer of the pelvic organs. These include your ovaries, uterus, and vagina. This screening involves a pelvic exam, which includes checking for microscopic changes to the surface of your cervix (Pap test).  For women ages 21-65, health care providers may recommend a pelvic exam and a Pap test every three years. For women ages 79-65, they may recommend the Pap test and pelvic exam, combined with testing for human papilloma virus (HPV), every five years. Some types of HPV increase your risk of cervical cancer. Testing for HPV may also be done on women of any age who have unclear Pap test results.  Other health care providers may not recommend any screening for nonpregnant women who are considered low risk for pelvic cancer and have no symptoms. Ask your health care provider if a screening pelvic exam is right for you.  If you have had past treatment for cervical cancer or a condition that could lead to cancer, you need Pap tests and screening for cancer for at least 20 years after your treatment. If Pap tests have been discontinued for you, your risk factors (such as having a new sexual partner) need to be  reassessed to determine if you should start having screenings again. Some women have medical problems that increase the chance of getting cervical cancer. In these cases, your health care provider may recommend that you have screening and Pap tests more often.  If you have a family history of uterine cancer or ovarian cancer, talk with your health care provider about genetic screening.  If you have vaginal bleeding after reaching menopause, tell your health care provider.  There are currently no reliable tests available to screen for ovarian cancer.  Lung Cancer Lung cancer screening is recommended for adults 69-62 years old who are at high risk for lung cancer because of a history of smoking. A yearly low-dose CT scan of the lungs is recommended if you:  Currently smoke.  Have a history of at least 30 pack-years of smoking and you currently smoke or have quit within the past 15 years. A pack-year is smoking an average of one pack of cigarettes per day for one year.  Yearly screening should:  Continue until it has been 15 years since you quit.  Stop if you develop a health problem that would prevent you from having lung cancer treatment.  Colorectal Cancer  This type of cancer can be detected and can often be prevented.  Routine colorectal cancer screening usually begins at  age 42 and continues through age 45.  If you have risk factors for colon cancer, your health care provider may recommend that you be screened at an earlier age.  If you have a family history of colorectal cancer, talk with your health care provider about genetic screening.  Your health care provider may also recommend using home test kits to check for hidden blood in your stool.  A small camera at the end of a tube can be used to examine your colon directly (sigmoidoscopy or colonoscopy). This is done to check for the earliest forms of colorectal cancer.  Direct examination of the colon should be repeated every  5-10 years until age 71. However, if early forms of precancerous polyps or small growths are found or if you have a family history or genetic risk for colorectal cancer, you may need to be screened more often.  Skin Cancer  Check your skin from head to toe regularly.  Monitor any moles. Be sure to tell your health care provider: ? About any new moles or changes in moles, especially if there is a change in a mole's shape or color. ? If you have a mole that is larger than the size of a pencil eraser.  If any of your family members has a history of skin cancer, especially at a young age, talk with your health care provider about genetic screening.  Always use sunscreen. Apply sunscreen liberally and repeatedly throughout the day.  Whenever you are outside, protect yourself by wearing long sleeves, pants, a wide-brimmed hat, and sunglasses.  What should I know about osteoporosis? Osteoporosis is a condition in which bone destruction happens more quickly than new bone creation. After menopause, you may be at an increased risk for osteoporosis. To help prevent osteoporosis or the bone fractures that can happen because of osteoporosis, the following is recommended:  If you are 46-71 years old, get at least 1,000 mg of calcium and at least 600 mg of vitamin D per day.  If you are older than age 55 but younger than age 65, get at least 1,200 mg of calcium and at least 600 mg of vitamin D per day.  If you are older than age 54, get at least 1,200 mg of calcium and at least 800 mg of vitamin D per day.  Smoking and excessive alcohol intake increase the risk of osteoporosis. Eat foods that are rich in calcium and vitamin D, and do weight-bearing exercises several times each week as directed by your health care provider. What should I know about how menopause affects my mental health? Depression may occur at any age, but it is more common as you become older. Common symptoms of depression  include:  Low or sad mood.  Changes in sleep patterns.  Changes in appetite or eating patterns.  Feeling an overall lack of motivation or enjoyment of activities that you previously enjoyed.  Frequent crying spells.  Talk with your health care provider if you think that you are experiencing depression. What should I know about immunizations? It is important that you get and maintain your immunizations. These include:  Tetanus, diphtheria, and pertussis (Tdap) booster vaccine.  Influenza every year before the flu season begins.  Pneumonia vaccine.  Shingles vaccine.  Your health care provider may also recommend other immunizations. This information is not intended to replace advice given to you by your health care provider. Make sure you discuss any questions you have with your health care provider. Document Released: 04/04/2005  Document Revised: 08/31/2015 Document Reviewed: 11/14/2014 Elsevier Interactive Patient Education  2018 [employer]  

## 2016-10-06 LAB — PAP IG (IMAGE GUIDED)

## 2016-11-16 DIAGNOSIS — Z23 Encounter for immunization: Secondary | ICD-10-CM | POA: Diagnosis not present

## 2016-12-01 ENCOUNTER — Telehealth: Payer: Self-pay | Admitting: *Deleted

## 2016-12-01 ENCOUNTER — Other Ambulatory Visit: Payer: Self-pay | Admitting: Gynecology

## 2016-12-01 DIAGNOSIS — E2839 Other primary ovarian failure: Secondary | ICD-10-CM

## 2016-12-01 DIAGNOSIS — M8580 Other specified disorders of bone density and structure, unspecified site: Secondary | ICD-10-CM

## 2016-12-01 DIAGNOSIS — Z1231 Encounter for screening mammogram for malignant neoplasm of breast: Secondary | ICD-10-CM

## 2016-12-01 NOTE — Telephone Encounter (Signed)
Patient called requesting order for dexa placed at [facility] center. This was done pt will call to schedule.

## 2016-12-10 DIAGNOSIS — Z0000 Encounter for general adult medical examination without abnormal findings: Secondary | ICD-10-CM | POA: Diagnosis not present

## 2016-12-10 DIAGNOSIS — E782 Mixed hyperlipidemia: Secondary | ICD-10-CM | POA: Diagnosis not present

## 2016-12-10 DIAGNOSIS — M1712 Unilateral primary osteoarthritis, left knee: Secondary | ICD-10-CM | POA: Diagnosis not present

## 2016-12-10 DIAGNOSIS — I10 Essential (primary) hypertension: Secondary | ICD-10-CM | POA: Diagnosis not present

## 2017-01-05 ENCOUNTER — Ambulatory Visit
Admission: RE | Admit: 2017-01-05 | Discharge: 2017-01-05 | Disposition: A | Discharge status: Home / Self Care | Payer: Medicare Other | Source: Ambulatory Visit | Attending: Gynecology | Admitting: Gynecology

## 2017-01-05 ENCOUNTER — Ambulatory Visit: Payer: Medicare Other

## 2017-01-05 DIAGNOSIS — Z780 Asymptomatic menopausal state: Secondary | ICD-10-CM | POA: Diagnosis not present

## 2017-01-05 DIAGNOSIS — Z1231 Encounter for screening mammogram for malignant neoplasm of breast: Secondary | ICD-10-CM

## 2017-01-05 DIAGNOSIS — E2839 Other primary ovarian failure: Secondary | ICD-10-CM

## 2017-01-05 DIAGNOSIS — M85852 Other specified disorders of bone density and structure, left thigh: Secondary | ICD-10-CM | POA: Diagnosis not present

## 2017-01-06 ENCOUNTER — Encounter: Payer: Self-pay | Admitting: Gynecology

## 2017-02-06 DIAGNOSIS — Z87442 Personal history of urinary calculi: Secondary | ICD-10-CM | POA: Diagnosis not present

## 2017-02-06 DIAGNOSIS — Z91040 Latex allergy status: Secondary | ICD-10-CM | POA: Diagnosis not present

## 2017-02-06 DIAGNOSIS — Z882 Allergy status to sulfonamides status: Secondary | ICD-10-CM | POA: Diagnosis not present

## 2017-02-06 DIAGNOSIS — Z79899 Other long term (current) drug therapy: Secondary | ICD-10-CM | POA: Diagnosis not present

## 2017-02-06 DIAGNOSIS — Z888 Allergy status to other drugs, medicaments and biological substances status: Secondary | ICD-10-CM | POA: Diagnosis not present

## 2017-02-06 DIAGNOSIS — E042 Nontoxic multinodular goiter: Secondary | ICD-10-CM | POA: Diagnosis not present

## 2017-02-06 DIAGNOSIS — E039 Hypothyroidism, unspecified: Secondary | ICD-10-CM | POA: Diagnosis not present

## 2017-02-06 DIAGNOSIS — E782 Mixed hyperlipidemia: Secondary | ICD-10-CM | POA: Diagnosis not present

## 2017-02-06 DIAGNOSIS — Z7982 Long term (current) use of aspirin: Secondary | ICD-10-CM | POA: Diagnosis not present

## 2017-02-06 DIAGNOSIS — I10 Essential (primary) hypertension: Secondary | ICD-10-CM | POA: Diagnosis not present

## 2017-02-06 DIAGNOSIS — M8580 Other specified disorders of bone density and structure, unspecified site: Secondary | ICD-10-CM | POA: Diagnosis not present

## 2017-02-25 ENCOUNTER — Encounter: Payer: Self-pay | Admitting: Women's Health

## 2017-02-25 ENCOUNTER — Ambulatory Visit (INDEPENDENT_AMBULATORY_CARE_PROVIDER_SITE_OTHER): Payer: Medicare Other | Admitting: Women's Health

## 2017-02-25 VITALS — BP 140/80

## 2017-02-25 DIAGNOSIS — R350 Frequency of micturition: Secondary | ICD-10-CM

## 2017-02-25 MED ORDER — PHENAZOPYRIDINE HCL 200 MG PO TABS: 200.0000 mg | ORAL_TABLET | Freq: Three times a day (TID) | ORAL | 0 refills | Status: DC | PRN

## 2017-02-25 NOTE — Patient Instructions (Signed)

## 2017-02-25 NOTE — Progress Notes (Signed)
72 year old MWF G4 P3 presents with complaint of increased urinary frequency with urgency and pressure sensation for the past several days. Denies pain or burning at end of stream of urination, back pain, nausea or fever. Reports noting a pink tinge on toilet tissue. Denies vaginal discharge. Postmenopausal on no HRT with no vaginal bleeding. History of a kidney stone , followed at [facility], states pain is different. Hypertension, hypothyroid managed by primary care.  Exam: Appears well. No CVAT. External genitalia +2 rectocele, speculum exam no visible discharge, erythema or lesions noted, no visible blood vaginally. UA: Trace blood, negative nitrites, negative leukocytes, no wbc's, 0-2 RBCs, 0-5 squamous epithelials, few bacteria  Urinary frequency/urgency +2 rectocele asymptomatic  Plan: Reviewed UA negative. Urine culture pending. Will try Pyridium 200 mg 3 times daily, avoid  caffeine, instructed to call if continued problems.

## 2017-02-26 LAB — URINE CULTURE
MICRO NUMBER:: 90003258
Result:: NO GROWTH
SPECIMEN QUALITY:: ADEQUATE

## 2017-02-26 LAB — URINALYSIS W MICROSCOPIC + REFLEX CULTURE
Bilirubin Urine: NEGATIVE
Glucose, UA: NEGATIVE
HYALINE CAST: NONE SEEN /LPF
KETONES UR: NEGATIVE
Leukocyte Esterase: NEGATIVE
Nitrites, Initial: NEGATIVE
PROTEIN: NEGATIVE
Specific Gravity, Urine: 1.02 (ref 1.001–1.03)
WBC UA: NONE SEEN /HPF (ref 0–5)
pH: 6.5 (ref 5.0–8.0)

## 2017-02-26 LAB — NO CULTURE INDICATED

## 2017-03-19 DIAGNOSIS — E039 Hypothyroidism, unspecified: Secondary | ICD-10-CM | POA: Diagnosis not present

## 2017-03-19 DIAGNOSIS — E042 Nontoxic multinodular goiter: Secondary | ICD-10-CM | POA: Diagnosis not present

## 2017-05-12 DIAGNOSIS — N200 Calculus of kidney: Secondary | ICD-10-CM | POA: Diagnosis not present

## 2017-05-12 DIAGNOSIS — N2889 Other specified disorders of kidney and ureter: Secondary | ICD-10-CM | POA: Diagnosis not present

## 2017-05-12 DIAGNOSIS — I878 Other specified disorders of veins: Secondary | ICD-10-CM | POA: Diagnosis not present

## 2017-07-02 DIAGNOSIS — K219 Gastro-esophageal reflux disease without esophagitis: Secondary | ICD-10-CM | POA: Diagnosis not present

## 2017-07-02 DIAGNOSIS — Z800 Family history of malignant neoplasm of digestive organs: Secondary | ICD-10-CM | POA: Diagnosis not present

## 2017-07-02 DIAGNOSIS — R197 Diarrhea, unspecified: Secondary | ICD-10-CM | POA: Diagnosis not present

## 2017-08-21 DIAGNOSIS — H2513 Age-related nuclear cataract, bilateral: Secondary | ICD-10-CM | POA: Diagnosis not present

## 2017-10-09 ENCOUNTER — Encounter: Payer: Medicare Other | Admitting: Gynecology

## 2017-10-13 ENCOUNTER — Encounter: Payer: Self-pay | Admitting: Gynecology

## 2017-10-13 ENCOUNTER — Ambulatory Visit (INDEPENDENT_AMBULATORY_CARE_PROVIDER_SITE_OTHER): Payer: Medicare Other | Admitting: Gynecology

## 2017-10-13 VITALS — BP 118/78 | Ht 63.0 in | Wt 165.0 lb

## 2017-10-13 DIAGNOSIS — Z01419 Encounter for gynecological examination (general) (routine) without abnormal findings: Secondary | ICD-10-CM

## 2017-10-13 DIAGNOSIS — M8580 Other specified disorders of bone density and structure, unspecified site: Secondary | ICD-10-CM

## 2017-10-13 DIAGNOSIS — N816 Rectocele: Secondary | ICD-10-CM

## 2017-10-13 DIAGNOSIS — N952 Postmenopausal atrophic vaginitis: Secondary | ICD-10-CM

## 2017-10-13 NOTE — Patient Instructions (Signed)
Follow-up in 1 year for annual exam, sooner if any issues. 

## 2017-10-13 NOTE — Progress Notes (Signed)
    Lynn Collins Jun 12, 1945 [id number]        71 y.o.  [id number] for breast and pelvic exam.  Past medical history,surgical history, problem list, medications, allergies, family history and social history were all reviewed and documented as reviewed in the EPIC chart.  ROS:  Performed with pertinent positives and negatives included in the history, assessment and plan.   Additional significant findings : None   Exam: [name] assistant Vitals:   10/13/17 1200  BP: 118/78  Weight: 165 lb (74.8 kg)  Height: 5\' 3"  (1.6 m)   Body mass index is 29.23 kg/m.  General appearance:  Normal affect, orientation and appearance. Skin: Grossly normal HEENT: Without gross lesions.  No cervical or supraclavicular adenopathy. Thyroid normal.  Lungs:  Clear without wheezing, rales or rhonchi Cardiac: RR, without RMG Abdominal:  Soft, nontender, without masses, guarding, rebound, organomegaly or hernia Breasts:  Examined lying and sitting without masses, retractions, discharge or axillary adenopathy. Pelvic:  Ext, BUS, Vagina: With atrophic changes.  Second-degree rectocele.  Cervix: With atrophic changes  Uterus: Anteverted, normal size, shape and contour, midline and mobile nontender   Adnexa: Without masses or tenderness    Anus and perineum: Normal   Rectovaginal: Normal sphincter tone without palpated masses or tenderness.    Assessment/Plan:  72 y.o. [id number] female for annual gynecologic exam.   1. Postmenopausal/atrophic genital changes.  Doing well without significant menopausal symptoms or any vaginal bleeding. 2. History of ovarian fibroma.  Had been followed with serial ultrasounds.  Saw Nancy Marus in consultation who released her to routine follow-up.  We have discussed options to include continuing ultrasounds versus exam only and she is comfortable with exam only.  Her exam is normal today. 3. Osteopenia.  DEXA 12/2016 T score -1.8 FRAX 8% / 2.6%.  Plan follow-up DEXA next  year at 2-year interval. 4. Rectocele.  Second-degree rectocele stable on serial exams.  Asymptomatic to the patient.  Continue to monitor and report any symptoms. 5. Mammography coming due in November and I reminded her to schedule this.  Exam normal today. 6. Colonoscopy 2014.  Repeat at their recommended interval. 7. Pap smear 2018.  No Pap smear done today.  No history of significant abnormal Pap smears.  Options to stop screening per current screening guidelines based on age reviewed.  Will readdress on an annual basis. 8. Health maintenance.  No routine lab work done as patient does this elsewhere.  Follow-up 1 year, sooner as needed.   Anastasio Auerbach MD, 12:26 PM 10/13/2017

## 2017-10-30 DIAGNOSIS — Z1211 Encounter for screening for malignant neoplasm of colon: Secondary | ICD-10-CM | POA: Diagnosis not present

## 2017-10-30 DIAGNOSIS — K635 Polyp of colon: Secondary | ICD-10-CM | POA: Diagnosis not present

## 2017-11-04 DIAGNOSIS — Z1211 Encounter for screening for malignant neoplasm of colon: Secondary | ICD-10-CM | POA: Diagnosis not present

## 2017-11-04 DIAGNOSIS — K635 Polyp of colon: Secondary | ICD-10-CM | POA: Diagnosis not present

## 2017-12-03 DIAGNOSIS — Z23 Encounter for immunization: Secondary | ICD-10-CM | POA: Diagnosis not present

## 2017-12-09 DIAGNOSIS — M6740 Ganglion, unspecified site: Secondary | ICD-10-CM | POA: Diagnosis not present

## 2017-12-16 ENCOUNTER — Other Ambulatory Visit: Payer: Self-pay | Admitting: Gynecology

## 2017-12-16 DIAGNOSIS — Z1231 Encounter for screening mammogram for malignant neoplasm of breast: Secondary | ICD-10-CM

## 2017-12-23 DIAGNOSIS — M67442 Ganglion, left hand: Secondary | ICD-10-CM | POA: Diagnosis not present

## 2017-12-24 DIAGNOSIS — Z0000 Encounter for general adult medical examination without abnormal findings: Secondary | ICD-10-CM | POA: Diagnosis not present

## 2017-12-24 DIAGNOSIS — Z1159 Encounter for screening for other viral diseases: Secondary | ICD-10-CM | POA: Diagnosis not present

## 2017-12-24 DIAGNOSIS — R0981 Nasal congestion: Secondary | ICD-10-CM | POA: Diagnosis not present

## 2017-12-24 DIAGNOSIS — K219 Gastro-esophageal reflux disease without esophagitis: Secondary | ICD-10-CM | POA: Diagnosis not present

## 2017-12-24 DIAGNOSIS — I10 Essential (primary) hypertension: Secondary | ICD-10-CM | POA: Diagnosis not present

## 2017-12-24 DIAGNOSIS — E782 Mixed hyperlipidemia: Secondary | ICD-10-CM | POA: Diagnosis not present

## 2017-12-24 DIAGNOSIS — Z1389 Encounter for screening for other disorder: Secondary | ICD-10-CM | POA: Diagnosis not present

## 2018-01-08 DIAGNOSIS — E042 Nontoxic multinodular goiter: Secondary | ICD-10-CM | POA: Diagnosis not present

## 2018-01-08 DIAGNOSIS — E041 Nontoxic single thyroid nodule: Secondary | ICD-10-CM | POA: Diagnosis not present

## 2018-01-08 DIAGNOSIS — E039 Hypothyroidism, unspecified: Secondary | ICD-10-CM | POA: Diagnosis not present

## 2018-01-29 ENCOUNTER — Ambulatory Visit: Payer: Medicare Other

## 2018-02-08 DIAGNOSIS — J019 Acute sinusitis, unspecified: Secondary | ICD-10-CM | POA: Diagnosis not present

## 2018-02-19 ENCOUNTER — Ambulatory Visit
Admission: RE | Admit: 2018-02-19 | Discharge: 2018-02-19 | Disposition: A | Discharge status: Home / Self Care | Payer: Medicare Other | Source: Ambulatory Visit | Attending: Gynecology | Admitting: Gynecology

## 2018-02-19 DIAGNOSIS — Z1231 Encounter for screening mammogram for malignant neoplasm of breast: Secondary | ICD-10-CM | POA: Diagnosis not present

## 2018-03-05 DIAGNOSIS — E039 Hypothyroidism, unspecified: Secondary | ICD-10-CM | POA: Diagnosis not present

## 2018-03-05 DIAGNOSIS — E042 Nontoxic multinodular goiter: Secondary | ICD-10-CM | POA: Diagnosis not present

## 2018-04-12 ENCOUNTER — Other Ambulatory Visit: Payer: Self-pay

## 2018-04-12 ENCOUNTER — Encounter (HOSPITAL_COMMUNITY): Payer: Self-pay

## 2018-04-12 ENCOUNTER — Emergency Department (HOSPITAL_COMMUNITY)
Admission: EM | Admit: 2018-04-12 | Discharge: 2018-04-12 | Disposition: A | Discharge status: Home / Self Care | Payer: Medicare Other | Attending: Emergency Medicine | Admitting: Emergency Medicine

## 2018-04-12 DIAGNOSIS — Y939 Activity, unspecified: Secondary | ICD-10-CM | POA: Diagnosis not present

## 2018-04-12 DIAGNOSIS — Z79899 Other long term (current) drug therapy: Secondary | ICD-10-CM | POA: Insufficient documentation

## 2018-04-12 DIAGNOSIS — S61214A Laceration without foreign body of right ring finger without damage to nail, initial encounter: Secondary | ICD-10-CM | POA: Diagnosis not present

## 2018-04-12 DIAGNOSIS — E039 Hypothyroidism, unspecified: Secondary | ICD-10-CM | POA: Diagnosis not present

## 2018-04-12 DIAGNOSIS — W25XXXA Contact with sharp glass, initial encounter: Secondary | ICD-10-CM | POA: Insufficient documentation

## 2018-04-12 DIAGNOSIS — Y929 Unspecified place or not applicable: Secondary | ICD-10-CM | POA: Diagnosis not present

## 2018-04-12 DIAGNOSIS — Z7982 Long term (current) use of aspirin: Secondary | ICD-10-CM | POA: Insufficient documentation

## 2018-04-12 DIAGNOSIS — Y998 Other external cause status: Secondary | ICD-10-CM | POA: Insufficient documentation

## 2018-04-12 DIAGNOSIS — I10 Essential (primary) hypertension: Secondary | ICD-10-CM | POA: Diagnosis not present

## 2018-04-12 NOTE — ED Triage Notes (Signed)
Pt endorses breaking a glass around 1000 this am and cutting her right ring finger. Small laceration with some swelling present. Pt able to move all fingers, denies tingling/numbness. No active bleeding.

## 2018-04-12 NOTE — ED Notes (Signed)
Patient verbalizes understanding of discharge instructions. Opportunity for questioning and answers were provided. Armband removed by staff, pt discharged from ED ambulatory.   

## 2018-04-12 NOTE — ED Notes (Signed)
ED Provider at bedside. 

## 2018-04-12 NOTE — ED Provider Notes (Signed)
[facility] EMERGENCY DEPARTMENT Provider Note   CSN: [id number] Arrival date & time: 04/12/18  1424    History   Chief Complaint Chief Complaint  Patient presents with  . Laceration    HPI Lynn Collins is a 73 y.o. female.     HPI   42 YOF presents today with complaints of laceration. Pt notes this morning around 10 she cut her finger on a piece of glass.  She notes this was a small puncture to the right ring finger along the dorsal aspect over the PIP-denies any pain with range of motion, bleeding was controlled.  She notes her tetanus is up-to-date.  No history of diabetes.    Past Medical History:  Diagnosis Date  . Arthritis   . Atrophic vaginitis   . Diverticulitis   . Exposure to TB    possible per pt  . Hemorrhoids   . Hypertension   . Hypothyroidism    Nodule  . Kidney stone   . Osteopenia 12/2016   T score -1.8 FRAX 8%/2.6%  . Rectocele   . Varicose veins     Patient Active Problem List   Diagnosis Date Noted  . 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 CHOLECYSTECTOMY    . Thyroid nodule Bx     Benign  . TUBAL LIGATION       OB History    Gravida  4   Para  3   Term  3   Preterm      AB  1   Living  3     SAB  1   TAB      Ectopic      Multiple      Live Births               Home Medications    Prior to Admission medications   Medication Sig Start Date End Date Taking? Authorizing Provider  amLODipine (NORVASC) 2.5 MG tablet Take 2.5 mg by mouth daily.    [provider]  aspirin EC 81 MG tablet Take 81 mg by mouth daily.    [provider]  Calcium Carbonate-Vit D-Min (CALTRATE PLUS PO) Take 1 tablet by mouth 2 (two) times daily.     [provider]  fluticasone (FLONASE) 50 MCG/ACT nasal spray Place 1 spray into both nostrils daily as needed for allergies or rhinitis.    [provider]  levothyroxine (SYNTHROID, LEVOTHROID) 100 MCG tablet Take 112 mcg by mouth daily before breakfast.     [provider]  multivitamin (THERAGRAN) per tablet Take 1 tablet by mouth daily.      [provider]    Family History Family History  Problem Relation Age of Onset  . Diabetes Mother   . Hypertension Mother   . Uterine cancer Mother   . Heart disease Mother   . Cancer Father        prostate  . Colon cancer Paternal Aunt   . Heart attack Brother   . Cancer Brother        Prostate  . Cancer Daughter        Thyroid cancer  . Cancer Brother        Prostate  . Cancer Brother        Prostate    Social History Social History   Tobacco Use  . Smoking status:  Never Smoker  . Smokeless tobacco: Never Used  Substance Use Topics  . Alcohol use: Yes    Alcohol/week: 0.0 standard drinks    Comment: rare  . Drug use: No     Allergies   Kapidex [dexlansoprazole] and Sulfonamide derivatives   Review of Systems Review of Systems  All other systems reviewed and are negative.    Physical Exam Updated Vital Signs BP 123/82 (BP Location: Left Arm)   Pulse 84   Temp 97.8 F (36.6 C) (Oral)   Resp 16   Ht 5\' 3"  (1.6 m)   Wt 73.5 kg   SpO2 98%   BMI 28.70 kg/m   Physical Exam Vitals signs and nursing note reviewed.  Constitutional:      Appearance: She is well-developed.  HENT:     Head: Normocephalic and atraumatic.  Eyes:     General: No scleral icterus.       Right eye: No discharge.        Left eye: No discharge.     Conjunctiva/sclera: Conjunctivae normal.     Pupils: Pupils are equal, round, and reactive to light.  Neck:     Musculoskeletal: Normal range of motion.     Vascular: No JVD.     Trachea: No tracheal deviation.  Pulmonary:     Effort: Pulmonary effort is normal.     Breath sounds: No stridor.  Musculoskeletal:     Comments: Puncture noted of the dorsal PIP of the right ring finger-this does not appear to  extend past the dermis-joint supple full active range of motion  Neurological:     Mental Status: She is alert and oriented to person, place, and time.     Coordination: Coordination normal.  Psychiatric:        Behavior: Behavior normal.        Thought Content: Thought content normal.        Judgment: Judgment normal.      ED Treatments / Results  Labs (all labs ordered are listed, but only abnormal results are displayed) Labs Reviewed - No data to display  EKG None  Radiology No results found.  Procedures Procedures (including critical care time)  Medications Ordered in ED Medications - No data to display   Initial Impression / Assessment and Plan / ED Course  I have reviewed the triage vital signs and the nursing notes.  Pertinent labs & imaging results that were available during my care of the patient were reviewed by me and considered in my medical decision making (see chart for details).        73 year old female presents today with a puncture to her finger.  This appears to be superficial a do not appreciate any deep space involvement.  She has full active range of motion.  I do not feel that prophylactic antibiotics would be necessary in this situation and would likely cause more harm.  Patient will return immediately if she develops any signs of infection.  Verbalized understanding and agreement to today's plan had no further questions or concerns at time of discharge. Final Clinical Impressions(s) / ED Diagnoses   Final diagnoses:  Laceration of right ring finger without foreign body without damage to nail, initial encounter    ED Discharge Orders    None       Okey Regal, PA-C 04/12/18 1554    Long, Wonda [facility], MD 04/13/18 [phone]

## 2018-04-12 NOTE — Discharge Instructions (Addendum)
Please read attached information. If you experience any new or worsening signs or symptoms please return to the emergency room for evaluation. Please follow-up with your primary care provider or specialist as discussed.  °

## 2018-08-04 DIAGNOSIS — N200 Calculus of kidney: Secondary | ICD-10-CM | POA: Diagnosis not present

## 2018-08-06 DIAGNOSIS — N200 Calculus of kidney: Secondary | ICD-10-CM | POA: Diagnosis not present

## 2018-09-07 DIAGNOSIS — E039 Hypothyroidism, unspecified: Secondary | ICD-10-CM | POA: Diagnosis not present

## 2018-09-07 DIAGNOSIS — I10 Essential (primary) hypertension: Secondary | ICD-10-CM | POA: Diagnosis not present

## 2018-09-07 DIAGNOSIS — R000 Tachycardia, unspecified: Secondary | ICD-10-CM | POA: Diagnosis not present

## 2018-09-08 ENCOUNTER — Other Ambulatory Visit: Payer: Self-pay

## 2018-09-08 DIAGNOSIS — Z20822 Contact with and (suspected) exposure to covid-19: Secondary | ICD-10-CM

## 2018-09-11 LAB — NOVEL CORONAVIRUS, NAA: SARS-CoV-2, NAA: NOT DETECTED

## 2018-09-17 DIAGNOSIS — E042 Nontoxic multinodular goiter: Secondary | ICD-10-CM | POA: Diagnosis not present

## 2018-09-17 DIAGNOSIS — E039 Hypothyroidism, unspecified: Secondary | ICD-10-CM | POA: Diagnosis not present

## 2018-10-14 ENCOUNTER — Other Ambulatory Visit: Payer: Self-pay

## 2018-10-15 ENCOUNTER — Ambulatory Visit (INDEPENDENT_AMBULATORY_CARE_PROVIDER_SITE_OTHER): Payer: Medicare Other | Admitting: Gynecology

## 2018-10-15 ENCOUNTER — Encounter: Payer: Self-pay | Admitting: Gynecology

## 2018-10-15 VITALS — BP 118/78 | Ht 63.0 in | Wt 164.0 lb

## 2018-10-15 DIAGNOSIS — Z01419 Encounter for gynecological examination (general) (routine) without abnormal findings: Secondary | ICD-10-CM

## 2018-10-15 DIAGNOSIS — M8580 Other specified disorders of bone density and structure, unspecified site: Secondary | ICD-10-CM

## 2018-10-15 DIAGNOSIS — N816 Rectocele: Secondary | ICD-10-CM

## 2018-10-15 DIAGNOSIS — N952 Postmenopausal atrophic vaginitis: Secondary | ICD-10-CM

## 2018-10-15 NOTE — Progress Notes (Signed)
    Lynn Collins August 29, 1945 [id number]        72 y.o.  [id number] for breast and pelvic exam.  Without gynecologic complaints  Past medical history,surgical history, problem list, medications, allergies, family history and social history were all reviewed and documented as reviewed in the EPIC chart.  ROS:  Performed with pertinent positives and negatives included in the history, assessment and plan.   Additional significant findings : None   Exam: Lynn Collins assistant Vitals:   10/15/18 1152  BP: 118/78  Weight: 164 lb (74.4 kg)  Height: 5\' 3"  (1.6 m)   Body mass index is 29.05 kg/m.  General appearance:  Normal affect, orientation and appearance. Skin: Grossly normal HEENT: Without gross lesions.  No cervical or supraclavicular adenopathy. Thyroid normal.  Lungs:  Clear without wheezing, rales or rhonchi Cardiac: RR, without RMG Abdominal:  Soft, nontender, without masses, guarding, rebound, organomegaly or hernia Breasts:  Examined lying and sitting without masses, retractions, discharge or axillary adenopathy. Pelvic:  Ext, BUS, Vagina: With atrophic changes.  Second-degree rectocele  Cervix: With atrophic changes.  Uterus: Anteverted, normal size, shape and contour, midline and mobile nontender   Adnexa: Without masses or tenderness    Anus and perineum: Normal   Rectovaginal: Normal sphincter tone without palpated masses or tenderness.    Assessment/Plan:  73 y.o. [id number] female for breast and pelvic exam  1. Postmenopausal.  No significant menopausal symptoms or any vaginal bleeding. 2. Osteopenia.  DEXA 2018 T score -1.8 FRAX 8% / 2.6%.  Recommend follow-up DEXA now she will schedule in follow-up for this. 3. Rectocele, second-degree.  Stable on serial exams.  Asymptomatic to the patient. 4. History of ovarian fibroma.  Saw Dr. Alycia Collins in consultation.  Had serial ultrasounds showing stability of this small area on the left.  We decided to follow with exams and  her exam is normal today.  She has no symptoms. 5. Pap smear 09/2016.  No Pap smear done today.  No history of abnormal Pap smears.  Options to stop screening per current screening guidelines reviewed.  Will readdress on an annual basis. 6. Colonoscopy 2014.  Repeat at their recommended interval. 7. Mammography 01/2018.  Follow-up for annual mammography end of this year beginning of next.  Breast exam normal today. 8. Health maintenance.  No routine lab work done as patient does this elsewhere.  Follow-up 1 year, sooner as needed.   Lynn Auerbach MD, 12:29 PM 10/15/2018

## 2018-10-15 NOTE — Patient Instructions (Signed)
Follow-up for the bone density as scheduled. 

## 2018-10-28 DIAGNOSIS — Z23 Encounter for immunization: Secondary | ICD-10-CM | POA: Diagnosis not present

## 2018-12-01 ENCOUNTER — Encounter: Payer: Self-pay | Admitting: Gynecology

## 2018-12-10 DIAGNOSIS — E042 Nontoxic multinodular goiter: Secondary | ICD-10-CM | POA: Diagnosis not present

## 2018-12-10 DIAGNOSIS — E039 Hypothyroidism, unspecified: Secondary | ICD-10-CM | POA: Diagnosis not present

## 2018-12-31 DIAGNOSIS — H2513 Age-related nuclear cataract, bilateral: Secondary | ICD-10-CM | POA: Diagnosis not present

## 2019-01-01 ENCOUNTER — Other Ambulatory Visit: Payer: Self-pay

## 2019-01-01 DIAGNOSIS — Z20822 Contact with and (suspected) exposure to covid-19: Secondary | ICD-10-CM

## 2019-01-02 LAB — NOVEL CORONAVIRUS, NAA: SARS-CoV-2, NAA: NOT DETECTED

## 2019-01-03 ENCOUNTER — Telehealth: Payer: Self-pay | Admitting: Family Medicine

## 2019-01-03 NOTE — Telephone Encounter (Signed)
Pt advised of Negative COVID result.  Pt expressed understanding.

## 2019-01-17 ENCOUNTER — Other Ambulatory Visit: Payer: Self-pay

## 2019-01-18 ENCOUNTER — Encounter: Payer: Self-pay | Admitting: Gynecology

## 2019-01-18 ENCOUNTER — Ambulatory Visit (INDEPENDENT_AMBULATORY_CARE_PROVIDER_SITE_OTHER): Payer: Medicare Other

## 2019-01-18 ENCOUNTER — Other Ambulatory Visit: Payer: Self-pay | Admitting: Gynecology

## 2019-01-18 ENCOUNTER — Other Ambulatory Visit: Payer: Self-pay

## 2019-01-18 DIAGNOSIS — M8589 Other specified disorders of bone density and structure, multiple sites: Secondary | ICD-10-CM

## 2019-01-18 DIAGNOSIS — M8580 Other specified disorders of bone density and structure, unspecified site: Secondary | ICD-10-CM

## 2019-01-18 DIAGNOSIS — Z780 Asymptomatic menopausal state: Secondary | ICD-10-CM

## 2019-01-22 DIAGNOSIS — R197 Diarrhea, unspecified: Secondary | ICD-10-CM | POA: Diagnosis not present

## 2019-01-22 DIAGNOSIS — R1110 Vomiting, unspecified: Secondary | ICD-10-CM | POA: Diagnosis not present

## 2019-01-22 DIAGNOSIS — R42 Dizziness and giddiness: Secondary | ICD-10-CM | POA: Diagnosis not present

## 2019-01-25 DIAGNOSIS — E782 Mixed hyperlipidemia: Secondary | ICD-10-CM | POA: Diagnosis not present

## 2019-01-25 DIAGNOSIS — I10 Essential (primary) hypertension: Secondary | ICD-10-CM | POA: Diagnosis not present

## 2019-01-25 DIAGNOSIS — E039 Hypothyroidism, unspecified: Secondary | ICD-10-CM | POA: Diagnosis not present

## 2019-01-28 DIAGNOSIS — E782 Mixed hyperlipidemia: Secondary | ICD-10-CM | POA: Diagnosis not present

## 2019-01-28 DIAGNOSIS — I10 Essential (primary) hypertension: Secondary | ICD-10-CM | POA: Diagnosis not present

## 2019-01-28 DIAGNOSIS — Z0000 Encounter for general adult medical examination without abnormal findings: Secondary | ICD-10-CM | POA: Diagnosis not present

## 2019-01-28 DIAGNOSIS — E042 Nontoxic multinodular goiter: Secondary | ICD-10-CM | POA: Diagnosis not present

## 2019-01-28 DIAGNOSIS — E041 Nontoxic single thyroid nodule: Secondary | ICD-10-CM | POA: Diagnosis not present

## 2019-01-28 DIAGNOSIS — E039 Hypothyroidism, unspecified: Secondary | ICD-10-CM | POA: Diagnosis not present

## 2019-02-04 ENCOUNTER — Other Ambulatory Visit: Payer: Self-pay

## 2019-02-04 DIAGNOSIS — Z20822 Contact with and (suspected) exposure to covid-19: Secondary | ICD-10-CM

## 2019-02-04 DIAGNOSIS — Z20828 Contact with and (suspected) exposure to other viral communicable diseases: Secondary | ICD-10-CM | POA: Diagnosis not present

## 2019-02-05 LAB — NOVEL CORONAVIRUS, NAA: SARS-CoV-2, NAA: NOT DETECTED

## 2019-02-21 ENCOUNTER — Other Ambulatory Visit: Payer: Self-pay | Admitting: Obstetrics and Gynecology

## 2019-02-21 DIAGNOSIS — Z1231 Encounter for screening mammogram for malignant neoplasm of breast: Secondary | ICD-10-CM

## 2019-02-22 ENCOUNTER — Ambulatory Visit: Payer: Medicare Other

## 2019-03-08 ENCOUNTER — Ambulatory Visit: Payer: Medicare Other | Attending: Internal Medicine

## 2019-03-08 DIAGNOSIS — Z23 Encounter for immunization: Secondary | ICD-10-CM | POA: Insufficient documentation

## 2019-03-08 NOTE — Progress Notes (Signed)
   Covid-19 Vaccination Clinic  Name:  Lynn Collins    MRN: [id number] DOB: May 02, 1945  03/08/2019  [name] was observed post Covid-19 immunization for 15 minutes without incidence. She was provided with Vaccine Information Sheet and instruction to access the V-Safe system.   [name] was instructed to call 911 with any severe reactions post vaccine: [name] Kitchen Difficulty breathing  . Swelling of your face and throat  . A fast heartbeat  . A bad rash all over your body  . Dizziness and weakness    Immunizations Administered    Name Date Dose VIS Date Route   Pfizer COVID-19 Vaccine 03/08/2019 10:13 AM 0.3 mL 02/04/2019 Intramuscular   Manufacturer: [employer], [employer]   Lot: [id number]   [place]: [id number]

## 2019-03-17 ENCOUNTER — Other Ambulatory Visit (HOSPITAL_COMMUNITY): Payer: Self-pay | Admitting: Family Medicine

## 2019-03-17 ENCOUNTER — Other Ambulatory Visit: Payer: Self-pay | Admitting: Family Medicine

## 2019-03-17 DIAGNOSIS — R519 Headache, unspecified: Secondary | ICD-10-CM

## 2019-03-17 DIAGNOSIS — I10 Essential (primary) hypertension: Secondary | ICD-10-CM | POA: Diagnosis not present

## 2019-03-22 ENCOUNTER — Other Ambulatory Visit: Payer: Self-pay

## 2019-03-22 ENCOUNTER — Ambulatory Visit (HOSPITAL_COMMUNITY)
Admission: RE | Admit: 2019-03-22 | Discharge: 2019-03-22 | Disposition: A | Discharge status: Home / Self Care | Payer: Medicare Other | Source: Ambulatory Visit | Attending: Family Medicine | Admitting: Family Medicine

## 2019-03-22 ENCOUNTER — Ambulatory Visit: Payer: Medicare Other

## 2019-03-22 DIAGNOSIS — R42 Dizziness and giddiness: Secondary | ICD-10-CM | POA: Diagnosis not present

## 2019-03-22 DIAGNOSIS — R519 Headache, unspecified: Secondary | ICD-10-CM | POA: Insufficient documentation

## 2019-03-27 ENCOUNTER — Ambulatory Visit: Payer: Medicare Other | Attending: Internal Medicine

## 2019-03-27 DIAGNOSIS — Z23 Encounter for immunization: Secondary | ICD-10-CM | POA: Insufficient documentation

## 2019-03-27 NOTE — Progress Notes (Signed)
   Covid-19 Vaccination Clinic  Name:  Lynn Collins    MRN: [id number] DOB: 02/27/1945  03/27/2019  [name] was observed post Covid-19 immunization for 15 minutes without incidence. She was provided with Vaccine Information Sheet and instruction to access the V-Safe system.   [name] was instructed to call 911 with any severe reactions post vaccine: [name] Kitchen Difficulty breathing  . Swelling of your face and throat  . A fast heartbeat  . A bad rash all over your body  . Dizziness and weakness    Immunizations Administered    Name Date Dose VIS Date Route   Pfizer COVID-19 Vaccine 03/27/2019  9:45 AM 0.3 mL 02/04/2019 Intramuscular   Manufacturer: [place]   Lot: [id number]   NDC: [id number]

## 2019-04-05 DIAGNOSIS — E042 Nontoxic multinodular goiter: Secondary | ICD-10-CM | POA: Diagnosis not present

## 2019-04-05 DIAGNOSIS — E041 Nontoxic single thyroid nodule: Secondary | ICD-10-CM | POA: Diagnosis not present

## 2019-04-22 ENCOUNTER — Ambulatory Visit
Admission: RE | Admit: 2019-04-22 | Discharge: 2019-04-22 | Disposition: A | Discharge status: Home / Self Care | Payer: Medicare Other | Source: Ambulatory Visit | Attending: Obstetrics and Gynecology | Admitting: Obstetrics and Gynecology

## 2019-04-22 ENCOUNTER — Other Ambulatory Visit: Payer: Self-pay

## 2019-04-22 DIAGNOSIS — Z1231 Encounter for screening mammogram for malignant neoplasm of breast: Secondary | ICD-10-CM | POA: Diagnosis not present

## 2019-05-02 NOTE — Progress Notes (Signed)
NEUROLOGY CONSULTATION NOTE  Lynn Collins MRN: [id number] DOB: Nov 27, 1945  Referring provider: C. Melinda Crutch, MD Primary care provider: C. Melinda Crutch, MD  Reason for consult:  headache  HISTORY OF PRESENT ILLNESS: Lynn Collins is a 74 year old female with HTN, hypothyroidism and history of kidney stone who presents for headaches.  History supplemented by referring provider note.  On 03/16/2019, she woke up one morning with a diffuse right greater than left pressure-like headache when she got up in the morning.  She noted shooting pain shooting up her neck to the right side of her head.  She had associated lightheadedness but not spinning sensation.  No visual disturbance, slurred speech, facial droop, weakness or numbness.  Symptoms gradually improved and resolved over 4 or 5 days.  Blood pressure at that time was 160/100s, which is higher than usual.  Her amlodipine was doubled.  CT head without contrast on 03/23/2019 personally reviewed was normal.  Usually, BP is running 120s/78.  This morning, it is 150/83.  It may be because she didn't sleep well last night.  She did take her medicine this morning.  She denies history of headaches.  She has history of neck injury as a child and was treated for neck pain in 1997 and 2007.  MRI of cervical spine from 05/26/2005 showed primarily left sided facet hypertrophy causing mild foraminal narrowing    PAST MEDICAL HISTORY: Past Medical History:  Diagnosis Date  . Arthritis   . Atrophic vaginitis   . Diverticulitis   . Exposure to TB    possible per pt  . Hemorrhoids   . Hypertension   . Hypothyroidism    Nodule  . Kidney stone   . Osteopenia 12/2018   T score -1.8 FRAX 9.1% / 4.2%  . Rectocele   . Varicose veins     PAST SURGICAL HISTORY: Past Surgical History:  Procedure Laterality Date  . CHOLECYSTECTOMY    . Thyroid nodule Bx     Benign  . TUBAL LIGATION      MEDICATIONS: Current Outpatient Medications on File  Prior to Visit  Medication Sig Dispense Refill  . amLODipine (NORVASC) 2.5 MG tablet Take 2.5 mg by mouth daily.    . Calcium Carbonate-Vit D-Min (CALTRATE PLUS PO) Take 1 tablet by mouth 2 (two) times daily.     . fluticasone (FLONASE) 50 MCG/ACT nasal spray Place 1 spray into both nostrils daily as needed for allergies or rhinitis.    [name] Kitchen levothyroxine (SYNTHROID, LEVOTHROID) 100 MCG tablet Take 100 mcg by mouth daily before breakfast.     . multivitamin (THERAGRAN) per tablet Take 1 tablet by mouth daily.       No current facility-administered medications on file prior to visit.    ALLERGIES: Allergies  Allergen Reactions  . Kapidex [Dexlansoprazole] Rash  . Sulfonamide Derivatives Rash    FAMILY HISTORY: Family History  Problem Relation Age of Onset  . Diabetes Mother   . Hypertension Mother   . Uterine cancer Mother   . Heart disease Mother   . Cancer Father        prostate  . Colon cancer Paternal Aunt   . Heart attack Brother   . Cancer Brother        Prostate  . Cancer Daughter        Thyroid cancer  . Cancer Brother        Prostate  . Cancer Brother        Prostate  SOCIAL HISTORY: Social History   Socioeconomic History  . Marital status: Married    Spouse name: Not on file  . Number of children: Not on file  . Years of education: Not on file  . Highest education level: Not on file  Occupational History  . Not on file  Tobacco Use  . Smoking status: Never Smoker  . Smokeless tobacco: Never Used  Substance and Sexual Activity  . Alcohol use: Yes    Alcohol/week: 0.0 standard drinks    Comment: rare  . Drug use: No  . Sexual activity: Not Currently    Birth control/protection: Surgical    Comment: Tubal lig-1st intercourse 20 yo-1 partner  Other Topics Concern  . Not on file  Social History Narrative  . Not on file   Social Determinants of Health   Financial Resource Strain:   . Difficulty of Paying Living Expenses: Not on file  Food  Insecurity:   . Worried About Charity fundraiser in the Last Year: Not on file  . Ran Out of Food in the Last Year: Not on file  Transportation Needs:   . Lack of Transportation (Medical): Not on file  . Lack of Transportation (Non-Medical): Not on file  Physical Activity:   . Days of Exercise per Week: Not on file  . Minutes of Exercise per Session: Not on file  Stress:   . Feeling of Stress : Not on file  Social Connections:   . Frequency of Communication with Friends and Family: Not on file  . Frequency of Social Gatherings with Friends and Family: Not on file  . Attends Religious Services: Not on file  . Active Member of Clubs or Organizations: Not on file  . Attends Archivist Meetings: Not on file  . Marital Status: Not on file  Intimate Partner Violence:   . Fear of Current or Ex-Partner: Not on file  . Emotionally Abused: Not on file  . Physically Abused: Not on file  . Sexually Abused: Not on file    REVIEW OF SYSTEMS: Constitutional: No fevers, chills, or sweats, no generalized fatigue, change in appetite Eyes: No visual changes, double vision, eye pain Ear, nose and throat: No hearing loss, ear pain, nasal congestion, sore throat Cardiovascular: No chest pain, palpitations Respiratory:  No shortness of breath at rest or with exertion, wheezes GastrointestinaI: No nausea, vomiting, diarrhea, abdominal pain, fecal incontinence Genitourinary:  No dysuria, urinary retention or frequency Musculoskeletal:  No neck pain, back pain Integumentary: No rash, pruritus, skin lesions Neurological: as above Psychiatric: No depression, insomnia, anxiety Endocrine: No palpitations, fatigue, diaphoresis, mood swings, change in appetite, change in weight, increased thirst Hematologic/Lymphatic:  No purpura, petechiae. Allergic/Immunologic: no itchy/runny eyes, nasal congestion, recent allergic reactions, rashes  PHYSICAL EXAM: Blood pressure (!) 150/83, pulse 84, resp.  rate 18, height 5\' 3"  (1.6 m), weight 166 lb (75.3 kg), SpO2 97 %. General: No acute distress.  Patient appears well-groomed.  Head:  Normocephalic/atraumatic Eyes:  fundi examined but not visualized Neck: supple, no paraspinal tenderness, full range of motion Back: No paraspinal tenderness Heart: regular rate and rhythm Lungs: Clear to auscultation bilaterally. Vascular: No carotid bruits. Neurological Exam: Mental status: alert and oriented to person, place, and time, recent and remote memory intact, fund of knowledge intact, attention and concentration intact, speech fluent and not dysarthric, language intact. Cranial nerves: CN I: not tested CN II: pupils equal, round and reactive to light, visual fields intact CN III, IV, VI:  full range of motion, no nystagmus, no ptosis CN V: facial sensation intact CN VII: upper and lower face symmetric CN VIII: hearing intact CN IX, X: gag intact, uvula midline CN XI: sternocleidomastoid and trapezius muscles intact CN XII: tongue midline Bulk & Tone: normal, no fasciculations. Motor:  5/5 throughout Sensation:  Pinprick and vibration sensation intact. Deep Tendon Reflexes:  2+ throughout, toes downgoing.  Finger to nose testing:  Without dysmetria.  Heel to shin:  Without dysmetria.  Gait:  Normal station and stride.  Able to turn.  Romberg negative.  IMPRESSION: Episode of headache and dizziness/lightheadedness in setting of elevated blood pressure.  No reported lateralizing or focal symptoms.  Unclear if symptoms related to elevated blood pressure.  She has history of intermittent neck pain and appears that she may have had aggravated cervicogenic component.  Nonetheless, symptoms have resolved.  No further workup warranted.  Continue to monitor and return if symptoms return or has new symptoms.  Advised to repeat blood pressure at home and if still elevated, to contact her PCP's office   Thank you for allowing me to take part in the care  of this patient.  Metta Clines, DO  CC: C. Melinda Crutch, MD

## 2019-05-03 ENCOUNTER — Other Ambulatory Visit: Payer: Self-pay

## 2019-05-03 ENCOUNTER — Encounter: Payer: Self-pay | Admitting: Neurology

## 2019-05-03 ENCOUNTER — Ambulatory Visit (INDEPENDENT_AMBULATORY_CARE_PROVIDER_SITE_OTHER): Payer: Medicare Other | Admitting: Neurology

## 2019-05-03 VITALS — BP 150/83 | HR 84 | Resp 18 | Ht 63.0 in | Wt 166.0 lb

## 2019-05-03 DIAGNOSIS — R42 Dizziness and giddiness: Secondary | ICD-10-CM | POA: Diagnosis not present

## 2019-05-03 DIAGNOSIS — R519 Headache, unspecified: Secondary | ICD-10-CM

## 2019-05-03 DIAGNOSIS — I10 Essential (primary) hypertension: Secondary | ICD-10-CM | POA: Diagnosis not present

## 2019-05-03 NOTE — Patient Instructions (Signed)
Unclear what was the cause of headache and dizziness.  A pinched nerve in the neck may have been aggravated.  Just monitor.  Recheck blood pressure at home and if still elevated, contact Dr. Alan Ripper office

## 2019-06-06 ENCOUNTER — Other Ambulatory Visit: Payer: Self-pay

## 2019-06-07 ENCOUNTER — Encounter: Payer: Self-pay | Admitting: Obstetrics and Gynecology

## 2019-06-07 ENCOUNTER — Ambulatory Visit (INDEPENDENT_AMBULATORY_CARE_PROVIDER_SITE_OTHER): Payer: Medicare Other | Admitting: Obstetrics and Gynecology

## 2019-06-07 VITALS — BP 124/80

## 2019-06-07 DIAGNOSIS — R1032 Left lower quadrant pain: Secondary | ICD-10-CM

## 2019-06-07 DIAGNOSIS — D271 Benign neoplasm of left ovary: Secondary | ICD-10-CM | POA: Diagnosis not present

## 2019-06-07 NOTE — Progress Notes (Addendum)
   Lynn Collins  04/21/1945 [id number]  HPI The patient is a 74 y.o. [phone] with a history of a stable left ovarian fibroma and diverticulosis who presents today for evaluation of the left lower quadrant discomfort.  The last month or so she has complained of burning when she feels her colon is full just before having a bowel movement. Symptoms tend to resolve with having a bowel movement.  She was previously being followed by Dr. Alycia Rossetti in gyn oncology after she was incidentally diagnosed with a left ovarian fibroma on pelvic ultrasound several years back.  Otherwise no discomfort or symptoms from her left abdomen since then, so the symptoms she is experiencing now are new.  She reports a history of diverticulosis.  She says she is having a daily bowel movements but is taking Colace to prevent constipation as this irritates her hemorrhoids.  No pain with passage of bowel movement.  No blood in stool.  No trapping of stool. No associated nausea, pain with movement, or fever/chills.  Past medical history,surgical history, problem list, medications, allergies, family history and social history were all reviewed and documented as reviewed in the EPIC chart.  ROS:  Feeling well. No dyspnea or chest pain on exertion.  No abdominal pain, change in bowel habits, black or bloody stools.  No urinary tract symptoms. GYN ROS:  no abnormal bleeding, no vaginal discharge, +LLQ pain  Physical Exam  BP 124/80  General: Pleasant female, no acute distress, alert and oriented Abdomen: Soft, nontender, nondistended, no sign of hernia in LLQ PELVIC EXAM: VULVA: normal appearing vulva with no masses, tenderness or lesions, atrophic changes noted, VAGINA: normal appearing vagina with normal color and discharge, no lesions, grade 2 rectocele, CERVIX: normal appearing cervix without discharge or lesions, UTERUS: uterus is normal size, shape, consistency and nontender, ADNEXA: normal adnexa in size, nontender and no  masses  Assessment 74 yo [id number] with intermittent LLQ pain of uncertain etiology, history of diverticulosis, left ovarian fibroma, rectocele  Plan No abnormalities on her pelvic examination today.  Unable to reproduce pain on the pelvic or abdominal examinations today.  For reassurance purposes it would be reasonable to repeat a pelvic ultrasound to evaluate the left ovary given the history of the fibroma which appeared to be stable after serial ultrasounds.  She does have another upcoming abdominal area ultrasound in about 6 weeks, so I will have staff see if we can coordinate a pelvic ultrasound at that time.  I do not think the rectocele is what is causing her intermittent LLQ pain since she is not having any problems with stool trapping or function of her bowel movements other than as related to her hemorrhoid symptoms.  I do recommend that she seek evaluation by her GI specialist given her history of diverticulosis and the relation of her symptoms to her bowel patterns.   Joseph Pierini MD, FACOG 06/07/19

## 2019-07-15 DIAGNOSIS — R1032 Left lower quadrant pain: Secondary | ICD-10-CM | POA: Diagnosis not present

## 2019-07-19 ENCOUNTER — Other Ambulatory Visit: Payer: Self-pay

## 2019-07-20 ENCOUNTER — Ambulatory Visit (INDEPENDENT_AMBULATORY_CARE_PROVIDER_SITE_OTHER): Payer: Medicare Other | Admitting: Obstetrics and Gynecology

## 2019-07-20 ENCOUNTER — Ambulatory Visit (INDEPENDENT_AMBULATORY_CARE_PROVIDER_SITE_OTHER): Payer: Medicare Other

## 2019-07-20 ENCOUNTER — Encounter: Payer: Self-pay | Admitting: Obstetrics and Gynecology

## 2019-07-20 VITALS — BP 122/76

## 2019-07-20 DIAGNOSIS — R1032 Left lower quadrant pain: Secondary | ICD-10-CM | POA: Diagnosis not present

## 2019-07-20 DIAGNOSIS — N854 Malposition of uterus: Secondary | ICD-10-CM | POA: Diagnosis not present

## 2019-07-20 DIAGNOSIS — D271 Benign neoplasm of left ovary: Secondary | ICD-10-CM | POA: Diagnosis not present

## 2019-07-20 DIAGNOSIS — Z8742 Personal history of other diseases of the female genital tract: Secondary | ICD-10-CM

## 2019-07-20 DIAGNOSIS — Z01419 Encounter for gynecological examination (general) (routine) without abnormal findings: Secondary | ICD-10-CM

## 2019-07-20 NOTE — Progress Notes (Signed)
   Lynn Collins 1945-07-06 [id number]  SUBJECTIVE:  74 y.o. [id number] female presents for a scheduled pelvic ultrasound that was ordered in response to her presentation with left lower quadrant pain last month.  She has a history of a left ovarian fibroma that was followed, in addition diverticulosis.  She actually is no longer having any abdominal pelvic pain at this point.  Current Outpatient Medications  Medication Sig Dispense Refill  . amLODipine (NORVASC) 2.5 MG tablet Take 2.5 mg by mouth daily.    . Calcium Carbonate-Vit D-Min (CALTRATE PLUS PO) Take 1 tablet by mouth 2 (two) times daily.     . fluticasone (FLONASE) 50 MCG/ACT nasal spray Place 1 spray into both nostrils daily as needed for allergies or rhinitis.    [name] Kitchen levothyroxine (SYNTHROID, LEVOTHROID) 100 MCG tablet Take 100 mcg by mouth daily before breakfast.     . multivitamin (THERAGRAN) per tablet Take 1 tablet by mouth daily.       No current facility-administered medications for this visit.   Allergies: Kapidex [dexlansoprazole] and Sulfonamide derivatives  No LMP recorded. Patient is postmenopausal.  Past medical history,surgical history, problem list, medications, allergies, family history and social history were all reviewed and documented as reviewed in the EPIC chart.   OBJECTIVE:  BP 122/76  The patient appears well, alert, oriented x 3, in no distress. PELVIC EXAM: Deferred, performed at previous recent encounter  Pelvic ultrasound Small anteverted uterus, 5.7 x 4.0 x 2.6 cm, normal shape Symmetrical endometrium 1.9 mm thickness Bilateral ovaries small and atrophic with benign-appearing calcifications. No ovarian masses or growths appreciated. No adnexal masses, no free fluid.   ASSESSMENT:  74 y.o. [id number] here for a pelvic ultrasound  PLAN:  The patient is reassured by her normal pelvic ultrasound today.  She also is no longer having any abdominal pain or pelvic pain symptoms.  We will see her back  for her routine exam in August.   Joseph Pierini MD 07/20/19

## 2019-07-26 DIAGNOSIS — Z87442 Personal history of urinary calculi: Secondary | ICD-10-CM | POA: Diagnosis not present

## 2019-07-26 DIAGNOSIS — Z09 Encounter for follow-up examination after completed treatment for conditions other than malignant neoplasm: Secondary | ICD-10-CM | POA: Diagnosis not present

## 2019-07-26 DIAGNOSIS — N200 Calculus of kidney: Secondary | ICD-10-CM | POA: Diagnosis not present

## 2019-08-12 ENCOUNTER — Other Ambulatory Visit: Payer: Self-pay

## 2019-08-15 ENCOUNTER — Ambulatory Visit (INDEPENDENT_AMBULATORY_CARE_PROVIDER_SITE_OTHER): Payer: Medicare Other | Admitting: Obstetrics and Gynecology

## 2019-08-15 ENCOUNTER — Encounter: Payer: Self-pay | Admitting: Obstetrics and Gynecology

## 2019-08-15 ENCOUNTER — Other Ambulatory Visit: Payer: Self-pay

## 2019-08-15 VITALS — BP 130/80

## 2019-08-15 DIAGNOSIS — N649 Disorder of breast, unspecified: Secondary | ICD-10-CM | POA: Diagnosis not present

## 2019-08-15 DIAGNOSIS — N63 Unspecified lump in unspecified breast: Secondary | ICD-10-CM

## 2019-08-15 DIAGNOSIS — N644 Mastodynia: Secondary | ICD-10-CM | POA: Diagnosis not present

## 2019-08-15 NOTE — Progress Notes (Signed)
   MAHLIA FERNANDO 1945-05-14 [id number]  SUBJECTIVE:  74 y.o. [id number] female presents for evaluation of left breast pain.  She first noticed this about 10 days ago and also notices some sort of "pimple" on her left nipple which alarmed her.  The pimple is white in color and has not resolved.  She has tried running the breast under warm water in the shower to relieve the pimple.  She feels some discomfort when laying on her left side.  She feels some sort of lump or fullness over the left side as well in addition to fullness in nodes above the left collarbone.  She has been doing gardening for the last several months now so no new or recent changes to her physical activity regimen.  He has not had this kind of breast discomfort previously.  Routine screening mammogram was normal back in February.  Current Outpatient Medications  Medication Sig Dispense Refill  . amLODipine (NORVASC) 2.5 MG tablet Take 2.5 mg by mouth daily.    . Calcium Carbonate-Vit D-Min (CALTRATE PLUS PO) Take 1 tablet by mouth 2 (two) times daily.     . fluticasone (FLONASE) 50 MCG/ACT nasal spray Place 1 spray into both nostrils daily as needed for allergies or rhinitis.    [name] Kitchen levothyroxine (SYNTHROID, LEVOTHROID) 100 MCG tablet Take 100 mcg by mouth daily before breakfast.     . multivitamin (THERAGRAN) per tablet Take 1 tablet by mouth daily.       No current facility-administered medications for this visit.   Allergies: Kapidex [dexlansoprazole] and Sulfonamide derivatives  No LMP recorded. Patient is postmenopausal.  Past medical history,surgical history, problem list, medications, allergies, family history and social history were all reviewed and documented as reviewed in the EPIC chart.  OBJECTIVE:  BP 130/80  The patient appears well, alert, oriented x 3, in no distress.  BREAST EXAM:  left breast normal without well-defined mass, skin changes or axillary nodes, at 2:00 on the outer left upper quadrant there is  some streaky nodularity where the tenderness is located, the left nipple has a closed comedo or clogged duct at the tip, otherwise appears normal, no retraction, unable to express any nipple discharge  Chaperone: [name] present during the examination  ASSESSMENT:  74 y.o. [phone] here for evaluation of left breast mass and tenderness with possible clogged left nipple duct  PLAN:  No definite prominent mass on exam just some streaky nodularity where the tenderness is.  I think this still warrants a diagnostic left mammogram which will have staff help the patient schedule.  Encouraged her to use a warm compress over the nipple area and also the sore area to see if this will help open up any clogged ducts.  Recommend over-the-counter pain medicines as needed for any discomfort in the meantime.   Joseph Pierini MD 08/15/19

## 2019-08-16 ENCOUNTER — Telehealth: Payer: Self-pay | Admitting: *Deleted

## 2019-08-16 NOTE — Telephone Encounter (Signed)
-----   Message from Joseph Pierini, MD sent at 08/15/2019  3:39 PM EDT ----- Regarding: left diagnostic mammogram Please assist Lynn Collins with scheduling dx MMG for left breast, having pain and some nodularity with some sort of clogged duct on nipple. She mentioned she was hoping to go to different imaging center than where she had her regular MMG earlier this year. Thanks.

## 2019-08-16 NOTE — Telephone Encounter (Signed)
Patient informed she will need to get breast films transferred to [facility], scheduled on 09/01/19 @ 1:30pm, order faxed. Left message for patient to call to relay time and date.

## 2019-08-16 NOTE — Telephone Encounter (Signed)
Patient informed. 

## 2019-08-16 NOTE — Telephone Encounter (Signed)
Left message for patient to call to discuss.  

## 2019-09-01 ENCOUNTER — Encounter: Payer: Self-pay | Admitting: Obstetrics and Gynecology

## 2019-09-05 ENCOUNTER — Ambulatory Visit: Payer: Medicare Other | Attending: Internal Medicine

## 2019-10-04 ENCOUNTER — Other Ambulatory Visit: Payer: Self-pay | Admitting: Cardiology

## 2019-10-04 ENCOUNTER — Other Ambulatory Visit: Payer: Medicare Other

## 2019-10-04 DIAGNOSIS — Z20822 Contact with and (suspected) exposure to covid-19: Secondary | ICD-10-CM

## 2019-10-05 LAB — NOVEL CORONAVIRUS, NAA: SARS-CoV-2, NAA: NOT DETECTED

## 2019-10-05 LAB — SARS-COV-2, NAA 2 DAY TAT

## 2019-10-17 ENCOUNTER — Encounter: Payer: Medicare Other | Admitting: Obstetrics and Gynecology

## 2019-10-20 ENCOUNTER — Encounter: Payer: Medicare Other | Admitting: Obstetrics and Gynecology

## 2019-11-01 ENCOUNTER — Ambulatory Visit: Payer: Medicare Other

## 2019-11-10 DIAGNOSIS — Z23 Encounter for immunization: Secondary | ICD-10-CM | POA: Diagnosis not present

## 2019-11-15 ENCOUNTER — Encounter: Payer: Medicare Other | Admitting: Obstetrics and Gynecology

## 2019-11-16 DIAGNOSIS — R519 Headache, unspecified: Secondary | ICD-10-CM | POA: Diagnosis not present

## 2019-11-16 DIAGNOSIS — I10 Essential (primary) hypertension: Secondary | ICD-10-CM | POA: Diagnosis not present

## 2019-11-21 ENCOUNTER — Telehealth: Payer: Self-pay

## 2019-11-21 NOTE — Telephone Encounter (Signed)
Pt would like to know if she should proceed in getting her booster shot for covid tomorrow. On 9/16 she received the advanced flu vaccine and was feeling tired for 4 days. Feeling fine now, but wasn't sure if she should reschedule or not.

## 2019-11-21 NOTE — Telephone Encounter (Signed)
Attempted to reach x 2, "Call cannot be completed at this time."

## 2019-11-22 ENCOUNTER — Ambulatory Visit: Payer: Medicare Other | Attending: Internal Medicine

## 2019-11-22 DIAGNOSIS — Z23 Encounter for immunization: Secondary | ICD-10-CM

## 2019-11-22 NOTE — Progress Notes (Signed)
   Covid-19 Vaccination Clinic  Name:  Lynn Collins    MRN: [id number] DOB: 09-Mar-1945  11/22/2019  [name] was observed post Covid-19 immunization for 15 minutes without incident. She was provided with Vaccine Information Sheet and instruction to access the V-Safe system.   [name] was instructed to call 911 with any severe reactions post vaccine: [name] Kitchen Difficulty breathing  . Swelling of face and throat  . A fast heartbeat  . A bad rash all over body  . Dizziness and weakness

## 2019-11-22 NOTE — Telephone Encounter (Signed)
Phone call to pt.  Stated she had rec'd the flu vaccine, for seniors, on 9/16, and had about 4 days of not feeling well.  Stated she had headache, developed a cold sore on her nose, and just felt "blah."  Stated she was concerned whether she should take the COVID booster shot today, but her PCP advised that she could go ahead and take it.  Reported she felt fine today when she went to the appt. To receive her vaccine.  Questions answered.  Verb. appreciation for return phone call.

## 2019-12-07 ENCOUNTER — Other Ambulatory Visit: Payer: Self-pay

## 2019-12-07 ENCOUNTER — Other Ambulatory Visit: Payer: Medicare Other

## 2019-12-07 DIAGNOSIS — Z20822 Contact with and (suspected) exposure to covid-19: Secondary | ICD-10-CM | POA: Diagnosis not present

## 2019-12-09 LAB — SARS-COV-2, NAA 2 DAY TAT

## 2019-12-09 LAB — NOVEL CORONAVIRUS, NAA: SARS-CoV-2, NAA: NOT DETECTED

## 2019-12-13 ENCOUNTER — Encounter: Payer: Self-pay | Admitting: Obstetrics and Gynecology

## 2019-12-13 ENCOUNTER — Other Ambulatory Visit: Payer: Self-pay

## 2019-12-13 ENCOUNTER — Ambulatory Visit (INDEPENDENT_AMBULATORY_CARE_PROVIDER_SITE_OTHER): Payer: Medicare Other | Admitting: Obstetrics and Gynecology

## 2019-12-13 VITALS — BP 120/74 | Ht 62.0 in | Wt 164.0 lb

## 2019-12-13 DIAGNOSIS — Z01419 Encounter for gynecological examination (general) (routine) without abnormal findings: Secondary | ICD-10-CM | POA: Diagnosis not present

## 2019-12-13 DIAGNOSIS — Z124 Encounter for screening for malignant neoplasm of cervix: Secondary | ICD-10-CM

## 2019-12-13 DIAGNOSIS — M8580 Other specified disorders of bone density and structure, unspecified site: Secondary | ICD-10-CM

## 2019-12-13 DIAGNOSIS — D271 Benign neoplasm of left ovary: Secondary | ICD-10-CM

## 2019-12-13 DIAGNOSIS — N816 Rectocele: Secondary | ICD-10-CM

## 2019-12-13 NOTE — Addendum Note (Signed)
Addended by: Nelva Nay on: 12/13/2019 02:49 PM   Modules accepted: Orders

## 2019-12-13 NOTE — Progress Notes (Signed)
   Lynn Collins Apr 16, 1945 [id number]  SUBJECTIVE:  74 y.o. [id number] female here for a breast and pelvic exam and Pap smear. She has no gynecologic concerns. LLQ pain and left breast soreness have all resolved.  Current Outpatient Medications  Medication Sig Dispense Refill  . amLODipine (NORVASC) 2.5 MG tablet Take 2.5 mg by mouth daily.    . Calcium Carbonate-Vit D-Min (CALTRATE PLUS PO) Take 1 tablet by mouth 2 (two) times daily.     . fluticasone (FLONASE) 50 MCG/ACT nasal spray Place 1 spray into both nostrils daily as needed for allergies or rhinitis.    [name] Kitchen levothyroxine (SYNTHROID, LEVOTHROID) 100 MCG tablet Take 100 mcg by mouth daily before breakfast.     . multivitamin (THERAGRAN) per tablet Take 1 tablet by mouth daily.       No current facility-administered medications for this visit.   Allergies: Kapidex [dexlansoprazole] and Sulfonamide derivatives  No LMP recorded. Patient is postmenopausal.  Past medical history,surgical history, problem list, medications, allergies, family history and social history were all reviewed and documented as reviewed in the EPIC chart.  GYN ROS: no abnormal bleeding, pelvic pain or discharge, no breast pain or new or enlarging lumps on self exam.  No dysuria, urinary frequency, pain with urination, cloudy/malodorous urine.   OBJECTIVE:  BP 120/74   Ht 5\' 2"  (1.575 m)   Wt 164 lb (74.4 kg)   BMI 30.00 kg/m  The patient appears well, alert, oriented, in no distress.  BREAST EXAM: breasts appear normal, no suspicious masses, no skin or nipple changes or axillary nodes  PELVIC EXAM: VULVA: normal appearing vulva with atrophic change, no masses, tenderness or lesions, VAGINA: normal appearing vagina with atrophic change, normal color and discharge, no lesions, second-degree rectocele, CERVIX: normal appearing atrophic cervix without discharge or lesions, UTERUS: uterus is normal size, shape, consistency and nontender, ADNEXA: normal adnexa  in size, nontender and no masses, RECTAL: normal rectal, no masses, rectocele present, external hemorrhoids present, PAP: Pap smear done today, thin-prep method  Chaperone: [name] present during the examination  ASSESSMENT:  74 y.o. [id number] here for a breast and pelvic exam  PLAN:   1. Postmenopausal. No significant hot flashes or night sweats. No vaginal bleeding. 2. Pap smear 09/2016. No significant history of abnormal Pap smears. Discussed that she can stop screening based on current guidelines but she is not comfortable with this and would like to continue with screening. Pap smear is obtained today. 3. Rectocele, second-degree. Stable on exam today. Asymptomatic. 4. History of left ovarian fibroma. Previously seen by Dr. Alycia Rossetti in gynecologic oncology for consultation.  Following on her annual pelvic exams. Asymptomatic. 5. Mammogram 08/2019.  Normal breast exam today. Continue with annual mammograms. 6. Colonoscopy 2014.  She will follow up at the interval recommended by her GI specialist.   7. Osteopenia. DEXA 12/2018. T score -1.8, FRAX 9.1% / 4.2%. Supplementing with calcium and vitamin D. Encouraged weightbearing exercise. Next DEXA recommended 2022 so we will discuss at next year's visit. 8. Health maintenance.  No labs today as she normally has these completed elsewhere.  Return annually or sooner, prn.  Joseph Pierini MD 12/13/19

## 2019-12-14 LAB — PAP IG W/ RFLX HPV ASCU

## 2020-01-06 DIAGNOSIS — H40013 Open angle with borderline findings, low risk, bilateral: Secondary | ICD-10-CM | POA: Diagnosis not present

## 2020-01-06 DIAGNOSIS — H2513 Age-related nuclear cataract, bilateral: Secondary | ICD-10-CM | POA: Diagnosis not present

## 2020-01-26 ENCOUNTER — Other Ambulatory Visit: Payer: Medicare Other

## 2020-01-30 DIAGNOSIS — Z0000 Encounter for general adult medical examination without abnormal findings: Secondary | ICD-10-CM | POA: Diagnosis not present

## 2020-01-30 DIAGNOSIS — E039 Hypothyroidism, unspecified: Secondary | ICD-10-CM | POA: Diagnosis not present

## 2020-01-30 DIAGNOSIS — E782 Mixed hyperlipidemia: Secondary | ICD-10-CM | POA: Diagnosis not present

## 2020-01-30 DIAGNOSIS — I10 Essential (primary) hypertension: Secondary | ICD-10-CM | POA: Diagnosis not present

## 2020-02-02 ENCOUNTER — Other Ambulatory Visit: Payer: Medicare Other

## 2020-02-14 ENCOUNTER — Other Ambulatory Visit: Payer: Medicare Other

## 2020-02-14 ENCOUNTER — Other Ambulatory Visit: Payer: Self-pay

## 2020-02-14 DIAGNOSIS — Z20822 Contact with and (suspected) exposure to covid-19: Secondary | ICD-10-CM

## 2020-02-15 LAB — NOVEL CORONAVIRUS, NAA: SARS-CoV-2, NAA: NOT DETECTED

## 2020-02-15 LAB — SARS-COV-2, NAA 2 DAY TAT

## 2020-04-06 DIAGNOSIS — E042 Nontoxic multinodular goiter: Secondary | ICD-10-CM | POA: Diagnosis not present

## 2020-04-06 DIAGNOSIS — E039 Hypothyroidism, unspecified: Secondary | ICD-10-CM | POA: Diagnosis not present

## 2020-04-06 DIAGNOSIS — H04123 Dry eye syndrome of bilateral lacrimal glands: Secondary | ICD-10-CM | POA: Diagnosis not present

## 2020-04-10 DIAGNOSIS — Z1231 Encounter for screening mammogram for malignant neoplasm of breast: Secondary | ICD-10-CM | POA: Diagnosis not present

## 2020-04-13 DIAGNOSIS — E042 Nontoxic multinodular goiter: Secondary | ICD-10-CM | POA: Diagnosis not present

## 2020-05-04 DIAGNOSIS — R059 Cough, unspecified: Secondary | ICD-10-CM | POA: Diagnosis not present

## 2020-05-29 DIAGNOSIS — K5901 Slow transit constipation: Secondary | ICD-10-CM | POA: Diagnosis not present

## 2020-05-29 DIAGNOSIS — K649 Unspecified hemorrhoids: Secondary | ICD-10-CM | POA: Diagnosis not present

## 2020-05-31 DIAGNOSIS — Z23 Encounter for immunization: Secondary | ICD-10-CM | POA: Diagnosis not present

## 2020-06-21 DIAGNOSIS — H1010 Acute atopic conjunctivitis, unspecified eye: Secondary | ICD-10-CM | POA: Diagnosis not present

## 2020-06-21 DIAGNOSIS — E782 Mixed hyperlipidemia: Secondary | ICD-10-CM | POA: Diagnosis not present

## 2020-07-03 DIAGNOSIS — E782 Mixed hyperlipidemia: Secondary | ICD-10-CM | POA: Diagnosis not present

## 2020-07-03 DIAGNOSIS — H1010 Acute atopic conjunctivitis, unspecified eye: Secondary | ICD-10-CM | POA: Diagnosis not present

## 2020-07-24 DIAGNOSIS — N200 Calculus of kidney: Secondary | ICD-10-CM | POA: Diagnosis not present

## 2020-07-27 DIAGNOSIS — K5901 Slow transit constipation: Secondary | ICD-10-CM | POA: Diagnosis not present

## 2020-07-27 DIAGNOSIS — R140 Abdominal distension (gaseous): Secondary | ICD-10-CM | POA: Diagnosis not present

## 2020-07-27 DIAGNOSIS — R1032 Left lower quadrant pain: Secondary | ICD-10-CM | POA: Diagnosis not present

## 2020-09-11 DIAGNOSIS — M2550 Pain in unspecified joint: Secondary | ICD-10-CM | POA: Diagnosis not present

## 2020-09-11 DIAGNOSIS — M461 Sacroiliitis, not elsewhere classified: Secondary | ICD-10-CM | POA: Diagnosis not present

## 2020-09-11 DIAGNOSIS — I10 Essential (primary) hypertension: Secondary | ICD-10-CM | POA: Diagnosis not present

## 2020-09-14 DIAGNOSIS — J069 Acute upper respiratory infection, unspecified: Secondary | ICD-10-CM | POA: Diagnosis not present

## 2020-10-26 DIAGNOSIS — U071 COVID-19: Secondary | ICD-10-CM | POA: Diagnosis not present

## 2020-11-13 DIAGNOSIS — Z23 Encounter for immunization: Secondary | ICD-10-CM | POA: Diagnosis not present

## 2020-12-07 DIAGNOSIS — Z23 Encounter for immunization: Secondary | ICD-10-CM | POA: Diagnosis not present

## 2020-12-19 ENCOUNTER — Ambulatory Visit: Payer: Medicare Other | Admitting: Obstetrics and Gynecology

## 2020-12-25 NOTE — Progress Notes (Signed)
GYNECOLOGY  VISIT   HPI: 75 y.o.   Married  [place]  female   [phone] with No LMP recorded. Patient is postmenopausal.   here for breast and pelvic exam.   She is also followed for a rectocele, hx left ovarian fibroma, and osteopenia.   She had a pelvic US 07/20/19 for LLQ pain and was noted to have a normal uterus and normal bilateral ovaries with benign calcifications.  She has seen GYN ONC in the past.   Uses Metamucil to control her rectocele.  She has hemorrhoids.  Sees Dr. Matthias Hughs.   Not sexually active.   Walks a mile per day.  Gardens.  Walks the dog.   Husband has multiple myeloma.  Had her Covid booster and flu vaccine.   PCP:  Dr. Gildardo Cranker.  Endocrinology:  [facility]  GYNECOLOGIC HISTORY: No LMP recorded. Patient is postmenopausal. Contraception:  Tubal Menopausal hormone therapy: None Last mammogram: 04-10-20 3D/Neg/Birads1 BMD: 01-18-19  Osteopenia Last pap smear:  12-13-19 Neg, 10-02-16 Neg, 08-25-13 Neg. No history of abnormal paps.        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     Calcium Carbonate-Vit D-Min (CALTRATE PLUS PO) Take 1  tablet by mouth 2 (two) times daily.      fluticasone (FLONASE) 50 MCG/ACT nasal spray Place 1 spray into both nostrils daily as needed for allergies or rhinitis.     hydrocortisone (ANUSOL-HC) 2.5 % rectal cream 1 application     levothyroxine (SYNTHROID) 100 MCG tablet Take 1 tablet by mouth daily.     meloxicam (MOBIC) 7.5 MG tablet TAKE 1 TABLET BY MOUTH EVERY DAY AS NEEDED FOR BACK PAIN     multivitamin (THERAGRAN) per tablet Take 1 tablet by mouth daily.       Olopatadine HCl 0.2 % SOLN 1 drop     rosuvastatin (CRESTOR) 5 MG tablet TAKE 1 TABLET BY MOUTH ON MONDAY, WEDNESDAY, FRIDAY     No current facility-administered medications for this visit.     ALLERGIES: Sodium fluoride, Sulfa antibiotics, Tramadol hcl, Kapidex [dexlansoprazole], and Sulfonamide derivatives  Family History  Problem Relation Age of Onset   Diabetes Mother  Hypertension Mother    Uterine cancer Mother    Heart disease Mother    Cancer Father        prostate   Colon cancer Paternal Aunt    Heart attack Brother    Cancer Brother        Prostate   Cancer Daughter        Thyroid cancer   Cancer Brother        Prostate   Cancer Brother        Prostate    Social History   Socioeconomic History   Marital status: Married    Spouse name: Not on file   Number of children: 3   Years of education: 18   Highest education level: Not on file  Occupational History   Occupation: retired   Tobacco Use   Smoking status: Never   Smokeless tobacco: Never  Scientific laboratory technician Use: Never used  Substance and Sexual Activity   Alcohol use: Not Currently   Drug use: No   Sexual activity: Not Currently    Birth control/protection: Surgical    Comment: Tubal lig-1st intercourse 20 yo-1 partner  Other Topics Concern   Not on file  Social History Narrative   Right handed   Two story home   Drinks caffeine   Social Determinants of Health   Financial Resource Strain: Not on file  Food Insecurity: Not on  file  Transportation Needs: Not on file  Physical Activity: Not on file  Stress: Not on file  Social Connections: Not on file  Intimate Partner Violence: Not on file    Review of Systems  All other systems reviewed and are negative.  PHYSICAL EXAMINATION:    BP 122/76   Pulse 63   Ht 5' 2.5" (1.588 m)   Wt 161 lb (73 kg)   SpO2 98%   BMI 28.98 kg/m     General appearance: alert, cooperative and appears stated age Head: Normocephalic, without obvious abnormality, atraumatic Neck: no adenopathy, supple, symmetrical, trachea midline and thyroid normal to inspection and palpation Lungs: clear to auscultation bilaterally Breasts: normal appearance, no masses or tenderness, No nipple retraction or dimpling, No nipple discharge or bleeding, No axillary or supraclavicular adenopathy Heart: regular rate and rhythm Abdomen: soft, non-tender, no masses,  no organomegaly Extremities: extremities normal, atraumatic, no cyanosis or edema Skin: Skin color, texture, turgor normal. No rashes or lesions Lymph nodes: Cervical, supraclavicular, and axillary nodes normal. No abnormal inguinal nodes palpated Neurologic: Grossly normal  Pelvic: External genitalia:  no lesions              Urethra:  normal appearing urethra with no masses, tenderness or lesions              Bartholins and Skenes: normal                 Vagina: normal appearing vagina with normal color and discharge, no lesions.  Second degree rectocele.              Cervix: no lesions                Bimanual Exam:  Uterus:  normal size, contour, position, consistency, mobility, non-tender              Adnexa: no mass, fullness, tenderness              Rectal exam: yes.  Confirms.  Anus:  normal sphincter tone, hemorrhoids.  Chaperone was present for exam:  yes.  ASSESSMENT  Well woman with GYN examination.  Second degree rectocele.  Stable. Left ovarian fibroma.  Status post consultation with GYN ONC.   Osteopenia.  FH cancers.   PLAN  We discussed her stable rectocele and observational management along with Metamucil.  Mammogram yearly.  Self breast awareness encouraged.  Reassurance given regarding last pelvic ultrasound and absence of abnormal ovarian findings.  Calcium 1200 mg daily and at least 600 - 800 IU vit D daily recommended, through dietary sources if possible.  Weight bearing exercise and aerobic activity discussed. Schedule BMD.  Routine breast and pelvic exam in 2 years with pap if desired.  Fu yearly and prn for prolapse or any other concern.    An After Visit Summary was printed and given to the patient.  26 min  total time was spent for this patient encounter, including preparation, face-to-face counseling with the patient, coordination of care, and documentation of the encounter.

## 2020-12-26 ENCOUNTER — Ambulatory Visit (INDEPENDENT_AMBULATORY_CARE_PROVIDER_SITE_OTHER): Payer: Medicare Other | Admitting: Obstetrics and Gynecology

## 2020-12-26 ENCOUNTER — Other Ambulatory Visit: Payer: Self-pay

## 2020-12-26 ENCOUNTER — Encounter: Payer: Self-pay | Admitting: Obstetrics and Gynecology

## 2020-12-26 VITALS — BP 122/76 | HR 63 | Ht 62.5 in | Wt 161.0 lb

## 2020-12-26 DIAGNOSIS — N816 Rectocele: Secondary | ICD-10-CM

## 2020-12-26 DIAGNOSIS — Z01419 Encounter for gynecological examination (general) (routine) without abnormal findings: Secondary | ICD-10-CM | POA: Diagnosis not present

## 2020-12-26 DIAGNOSIS — M8580 Other specified disorders of bone density and structure, unspecified site: Secondary | ICD-10-CM | POA: Diagnosis not present

## 2020-12-28 NOTE — Patient Instructions (Signed)

## 2021-01-21 DIAGNOSIS — H40013 Open angle with borderline findings, low risk, bilateral: Secondary | ICD-10-CM | POA: Diagnosis not present

## 2021-01-21 DIAGNOSIS — H2513 Age-related nuclear cataract, bilateral: Secondary | ICD-10-CM | POA: Diagnosis not present

## 2021-01-30 DIAGNOSIS — Z0000 Encounter for general adult medical examination without abnormal findings: Secondary | ICD-10-CM | POA: Diagnosis not present

## 2021-01-30 DIAGNOSIS — M461 Sacroiliitis, not elsewhere classified: Secondary | ICD-10-CM | POA: Diagnosis not present

## 2021-01-30 DIAGNOSIS — E039 Hypothyroidism, unspecified: Secondary | ICD-10-CM | POA: Diagnosis not present

## 2021-01-30 DIAGNOSIS — E782 Mixed hyperlipidemia: Secondary | ICD-10-CM | POA: Diagnosis not present

## 2021-01-30 DIAGNOSIS — I10 Essential (primary) hypertension: Secondary | ICD-10-CM | POA: Diagnosis not present

## 2021-01-30 DIAGNOSIS — M1712 Unilateral primary osteoarthritis, left knee: Secondary | ICD-10-CM | POA: Diagnosis not present

## 2021-02-12 ENCOUNTER — Other Ambulatory Visit: Payer: Self-pay | Admitting: Obstetrics and Gynecology

## 2021-02-12 ENCOUNTER — Encounter (INDEPENDENT_AMBULATORY_CARE_PROVIDER_SITE_OTHER): Payer: Medicare Other

## 2021-02-12 ENCOUNTER — Other Ambulatory Visit: Payer: Self-pay

## 2021-02-12 DIAGNOSIS — M85852 Other specified disorders of bone density and structure, left thigh: Secondary | ICD-10-CM | POA: Diagnosis not present

## 2021-02-12 DIAGNOSIS — Z780 Asymptomatic menopausal state: Secondary | ICD-10-CM

## 2021-02-12 DIAGNOSIS — M85851 Other specified disorders of bone density and structure, right thigh: Secondary | ICD-10-CM | POA: Diagnosis not present

## 2021-02-22 DIAGNOSIS — M542 Cervicalgia: Secondary | ICD-10-CM | POA: Diagnosis not present

## 2021-02-22 DIAGNOSIS — I10 Essential (primary) hypertension: Secondary | ICD-10-CM | POA: Diagnosis not present

## 2021-02-22 DIAGNOSIS — E782 Mixed hyperlipidemia: Secondary | ICD-10-CM | POA: Diagnosis not present

## 2021-03-27 DIAGNOSIS — H93292 Other abnormal auditory perceptions, left ear: Secondary | ICD-10-CM | POA: Diagnosis not present

## 2021-03-27 DIAGNOSIS — M5412 Radiculopathy, cervical region: Secondary | ICD-10-CM | POA: Diagnosis not present

## 2021-04-17 DIAGNOSIS — Z79899 Other long term (current) drug therapy: Secondary | ICD-10-CM | POA: Diagnosis not present

## 2021-04-17 DIAGNOSIS — Z882 Allergy status to sulfonamides status: Secondary | ICD-10-CM | POA: Diagnosis not present

## 2021-04-17 DIAGNOSIS — I10 Essential (primary) hypertension: Secondary | ICD-10-CM | POA: Diagnosis not present

## 2021-04-17 DIAGNOSIS — E039 Hypothyroidism, unspecified: Secondary | ICD-10-CM | POA: Diagnosis not present

## 2021-04-17 DIAGNOSIS — R059 Cough, unspecified: Secondary | ICD-10-CM | POA: Diagnosis not present

## 2021-04-17 DIAGNOSIS — E042 Nontoxic multinodular goiter: Secondary | ICD-10-CM | POA: Diagnosis not present

## 2021-05-01 ENCOUNTER — Ambulatory Visit: Payer: Medicare Other | Admitting: Neurology

## 2021-05-08 DIAGNOSIS — R928 Other abnormal and inconclusive findings on diagnostic imaging of breast: Secondary | ICD-10-CM | POA: Diagnosis not present

## 2021-05-20 ENCOUNTER — Ambulatory Visit: Payer: Medicare Other | Admitting: Neurology

## 2021-05-20 NOTE — Progress Notes (Signed)
? ?NEUROLOGY FOLLOW UP OFFICE NOTE ? ?Terrance Mass ?[id number] ? ?Assessment/Plan:  ? ?Cervical radiculopathy aggravated by acute trauma/whiplash injury, resolving ?Suspect bilateral carpal tunnel syndrome as well ? ?We will continue to monitor for further resolution of symptoms.  Advised to wear wrist splints at night when she sleeps and as much during the day as possible.  If symptoms worsen, we can consider further testing such as NCV-EMG or cervical spine imaging.  Follow up.   ? ?Subjective:  ?Lynn Collins is a 76 year old female with HTN, hypothyroidism and history of kidney stone who presents today for cervical radiculopathy.  History supplemented by PCP note. ? ?She has a history of neck injury as a child with subsequent flare ups of neck pain over the years.  She was treated for neck pain in 1997 and 2007.  MRI of cervical spine from 05/26/2005 showed primarily left sided facet hypertrophy causing mild foraminal narrowing at C4-5, C5-6 and C6-7.  Cervical spine X-ray from 11/17/2014 showed diffuse degenerative changes with prominent disc space loss and endplate osteophyte formation at C5-6, C6-7 and C7-T1. ? ?In January, she was walking her dog when the dog suddenly pulled her hard forward with the leash.  Afterwards, she developed bilateral shoulder and upper arm pain (right worse than left) as well as numbness and tingling in the fingers.  For about a day, she also noticed some numbness on the left side of her mouth.  No weakness.  She initially treated with  meloxicam but it made her nauseous.  She started using Motrin and cervical collar.  She has improved.  Neck and shoulder pain resolved.  She still notes some numbness and tingling in the fingers, particularly the last two fingers of each hand.  She mostly notices it when she wakes up in the morning in bed and needs to shake it out.   ? ? ?PAST MEDICAL HISTORY: ?Past Medical History:  ?Diagnosis Date  ? Arthritis   ? Atrophic vaginitis   ?  Diverticulitis   ? Exposure to TB   ? possible per pt  ? Hemorrhoids   ? Hypertension   ? Hypothyroidism   ? Nodule  ? Kidney stone   ? Osteopenia 12/2018  ? T score -1.8 FRAX 9.1% / 4.2%  ? Rectocele   ? Varicose veins   ? ? ?MEDICATIONS: ?Current Outpatient Medications on File Prior to Visit  ?Medication Sig Dispense Refill  ? amLODipine (NORVASC) 2.5 MG tablet Take 2.5 mg by mouth daily.    ? Calcium Carbonate-Vit D-Min (CALTRATE PLUS PO) Take 1 tablet by mouth 2 (two) times daily.     ? fluticasone (FLONASE) 50 MCG/ACT nasal spray Place 1 spray into both nostrils daily as needed for allergies or rhinitis.    ? hydrocortisone (ANUSOL-HC) 2.5 % rectal cream 1 application    ? levothyroxine (SYNTHROID) 100 MCG tablet Take 1 tablet by mouth daily.    ? meloxicam (MOBIC) 7.5 MG tablet TAKE 1 TABLET BY MOUTH EVERY DAY AS NEEDED FOR BACK PAIN    ? multivitamin (THERAGRAN) per tablet Take 1 tablet by mouth daily.      ? Olopatadine HCl 0.2 % SOLN 1 drop    ? rosuvastatin (CRESTOR) 5 MG tablet TAKE 1 TABLET BY MOUTH ON MONDAY, WEDNESDAY, FRIDAY    ? ?No current facility-administered medications on file prior to visit.  ? ? ?ALLERGIES: ?Allergies  ?Allergen Reactions  ? Sodium Fluoride Other (See Comments)  ? Sulfa Antibiotics  Other (See Comments)  ? Tramadol Hcl Other (See Comments)  ? Kapidex [Dexlansoprazole] Rash  ? Sulfonamide Derivatives Rash  ? ? ?FAMILY HISTORY: ?Family History  ?Problem Relation Age of Onset  ? Diabetes Mother   ? Hypertension Mother   ? Uterine cancer Mother   ? Heart disease Mother   ? Cancer Father   ?     prostate  ? Colon cancer Paternal Aunt   ? Heart attack Brother   ? Cancer Brother   ?     Prostate  ? Cancer Daughter   ?     Thyroid cancer  ? Cancer Brother   ?     Prostate  ? Cancer Brother   ?     Prostate  ? ? ?  ?Objective:  ?Blood pressure 132/81, pulse 73, height '5\' 3"'$  (1.6 m), weight 164 lb (74.4 kg), SpO2 96 %. ?General: No acute distress.  Patient appears well-groomed.   ?Head:   Normocephalic/atraumatic ?Eyes:  Fundi examined but not visualized ?Neck: supple, no paraspinal tenderness, full range of motion ?Neurological Exam: alert and oriented to person, place, and time.  Speech fluent and not dysarthric, language intact.  CN II-XII intact. Bulk and tone normal, muscle strength 5/5 throughout.  Sensation to light touch reduced in thumbs and thenar eminences.  Deep tendon reflexes 2+ throughout, toes downgoing.  Finger to nose testing intact.  Gait normal, Romberg negative. ? ? ?Metta Clines, DO ? ?CC: C. Melinda Crutch, MD ? ? ? ? ? ? ?

## 2021-05-21 ENCOUNTER — Ambulatory Visit (INDEPENDENT_AMBULATORY_CARE_PROVIDER_SITE_OTHER): Payer: Medicare Other | Admitting: Neurology

## 2021-05-21 ENCOUNTER — Encounter: Payer: Self-pay | Admitting: Neurology

## 2021-05-21 ENCOUNTER — Other Ambulatory Visit: Payer: Self-pay

## 2021-05-21 VITALS — BP 132/81 | HR 73 | Ht 63.0 in | Wt 164.0 lb

## 2021-05-21 DIAGNOSIS — R200 Anesthesia of skin: Secondary | ICD-10-CM

## 2021-05-21 DIAGNOSIS — R202 Paresthesia of skin: Secondary | ICD-10-CM | POA: Diagnosis not present

## 2021-05-21 DIAGNOSIS — M5412 Radiculopathy, cervical region: Secondary | ICD-10-CM

## 2021-05-21 NOTE — Patient Instructions (Signed)
Try wearing wrist splints on both wrists at night (and as much during day as possible) to see if numbness in fingers improve.  It may be carpal tunnel ? ?

## 2021-05-22 ENCOUNTER — Encounter: Payer: Self-pay | Admitting: Obstetrics and Gynecology

## 2021-06-11 ENCOUNTER — Ambulatory Visit: Payer: Medicare Other | Admitting: Neurology

## 2021-06-22 ENCOUNTER — Other Ambulatory Visit: Payer: Self-pay | Admitting: Physician Assistant

## 2021-07-12 DIAGNOSIS — Z03818 Encounter for observation for suspected exposure to other biological agents ruled out: Secondary | ICD-10-CM | POA: Diagnosis not present

## 2021-07-12 DIAGNOSIS — J069 Acute upper respiratory infection, unspecified: Secondary | ICD-10-CM | POA: Diagnosis not present

## 2021-08-06 DIAGNOSIS — N83202 Unspecified ovarian cyst, left side: Secondary | ICD-10-CM | POA: Diagnosis not present

## 2021-08-06 DIAGNOSIS — N9489 Other specified conditions associated with female genital organs and menstrual cycle: Secondary | ICD-10-CM | POA: Diagnosis not present

## 2021-08-06 DIAGNOSIS — I10 Essential (primary) hypertension: Secondary | ICD-10-CM | POA: Diagnosis not present

## 2021-08-06 DIAGNOSIS — H9313 Tinnitus, bilateral: Secondary | ICD-10-CM | POA: Diagnosis not present

## 2021-08-06 DIAGNOSIS — Z79899 Other long term (current) drug therapy: Secondary | ICD-10-CM | POA: Diagnosis not present

## 2021-08-06 DIAGNOSIS — H9319 Tinnitus, unspecified ear: Secondary | ICD-10-CM | POA: Diagnosis not present

## 2021-08-06 DIAGNOSIS — K5790 Diverticulosis of intestine, part unspecified, without perforation or abscess without bleeding: Secondary | ICD-10-CM | POA: Diagnosis not present

## 2021-08-06 DIAGNOSIS — N200 Calculus of kidney: Secondary | ICD-10-CM | POA: Diagnosis not present

## 2021-08-06 DIAGNOSIS — J320 Chronic maxillary sinusitis: Secondary | ICD-10-CM | POA: Diagnosis not present

## 2021-08-06 DIAGNOSIS — E782 Mixed hyperlipidemia: Secondary | ICD-10-CM | POA: Diagnosis not present

## 2021-08-06 DIAGNOSIS — M8580 Other specified disorders of bone density and structure, unspecified site: Secondary | ICD-10-CM | POA: Diagnosis not present

## 2021-08-06 DIAGNOSIS — H9190 Unspecified hearing loss, unspecified ear: Secondary | ICD-10-CM | POA: Diagnosis not present

## 2021-08-06 DIAGNOSIS — Z882 Allergy status to sulfonamides status: Secondary | ICD-10-CM | POA: Diagnosis not present

## 2021-08-06 DIAGNOSIS — E039 Hypothyroidism, unspecified: Secondary | ICD-10-CM | POA: Diagnosis not present

## 2021-08-06 DIAGNOSIS — E041 Nontoxic single thyroid nodule: Secondary | ICD-10-CM | POA: Diagnosis not present

## 2021-08-13 ENCOUNTER — Other Ambulatory Visit: Payer: Self-pay | Admitting: Family Medicine

## 2021-08-13 DIAGNOSIS — I10 Essential (primary) hypertension: Secondary | ICD-10-CM | POA: Diagnosis not present

## 2021-08-13 DIAGNOSIS — E039 Hypothyroidism, unspecified: Secondary | ICD-10-CM | POA: Diagnosis not present

## 2021-08-13 DIAGNOSIS — E782 Mixed hyperlipidemia: Secondary | ICD-10-CM

## 2021-10-01 ENCOUNTER — Ambulatory Visit
Admission: RE | Admit: 2021-10-01 | Discharge: 2021-10-01 | Disposition: A | Discharge status: Home / Self Care | Payer: Medicare Other | Source: Ambulatory Visit | Attending: Family Medicine | Admitting: Family Medicine

## 2021-10-01 DIAGNOSIS — I700 Atherosclerosis of aorta: Secondary | ICD-10-CM | POA: Diagnosis not present

## 2021-10-01 DIAGNOSIS — E782 Mixed hyperlipidemia: Secondary | ICD-10-CM

## 2021-10-04 ENCOUNTER — Telehealth: Payer: Self-pay

## 2021-10-04 NOTE — Telephone Encounter (Signed)
NOTES SCANNED TO REFERRAL 

## 2021-11-10 NOTE — Progress Notes (Unsigned)
Cardiology Office Note:    Date:  11/11/2021   ID:  Lynn Collins, DOB [birth year], MRN [id number]  PCP:  Lawerance Cruel, MD  Cardiologist:  None   Referring MD: Lawerance Cruel, MD   Chief Complaint  Patient presents with   Coronary Artery Disease   Hyperlipidemia   Hypertension    History of Present Illness:    Lynn Collins is a 76 y.o. female with a hx of hyperlipidemia, primary hypertension, and elevated coronary calcium score (44 in August 2023)   Asymptomatic relative to PAD, CAD, and cerebrovascular symptoms.  Family history of elevated cholesterol.  I treated her sister who also had extremely elevated LDL cholesterol.  Her other risk factors are primary hypertension and age.  She denies orthopnea, PND, exertional related indigestion/chest pain.  Past Medical History:  Diagnosis Date   Arthritis    Atrophic vaginitis    Diverticulitis    Exposure to TB    possible per pt   Hemorrhoids    Hypertension    Hypothyroidism    Nodule   Kidney stone    Osteopenia 12/2018   T score -1.8 FRAX 9.1% / 4.2%   Rectocele    Varicose veins     Past Surgical History:  Procedure Laterality Date   CHOLECYSTECTOMY     Thyroid nodule Bx     Benign   TUBAL LIGATION      Current Medications: Current Meds  Medication Sig   amLODipine (NORVASC) 2.5 MG tablet Take 2.5 mg by mouth daily.   Calcium Carbonate-Vit D-Min (CALTRATE PLUS PO) Take 1 tablet by mouth 2 (two) times daily.    fluticasone (FLONASE) 50 MCG/ACT nasal spray Place 1 spray into both nostrils daily as needed for allergies or rhinitis.   hydrocortisone (ANUSOL-HC) 2.5 % rectal cream APPLY RECTALLY TO THE AFFECTED AREA TWICE DAILY FOR 14 DAYS   multivitamin (THERAGRAN) per tablet Take 1 tablet by mouth daily.     Olopatadine HCl 0.2 % SOLN 1 drop   Psyllium (METAMUCIL PO) Take by mouth every other day.   rosuvastatin (CRESTOR) 40 MG tablet Take 1 tablet (40 mg total) by mouth daily.    SYNTHROID 112 MCG tablet Take 112 mcg by mouth every morning.   [DISCONTINUED] levothyroxine (SYNTHROID) 100 MCG tablet Take 1 tablet by mouth daily.   [DISCONTINUED] rosuvastatin (CRESTOR) 20 MG tablet Take 20 mg by mouth daily.     Allergies:   Sodium fluoride, Sulfa antibiotics, Tramadol hcl, Kapidex [dexlansoprazole], and Sulfonamide derivatives   Social History   Socioeconomic History   Marital status: Married    Spouse name: Not on file   Number of children: 3   Years of education: 18   Highest education level: Not on file  Occupational History   Occupation: retired   Tobacco Use   Smoking status: Never   Smokeless tobacco: Never  Vaping Use   Vaping Use: Never used  Substance and Sexual Activity   Alcohol use: Not Currently   Drug use: No   Sexual activity: Not Currently    Birth control/protection: Surgical    Comment: Tubal lig-1st intercourse 20 yo-1 partner  Other Topics Concern   Not on file  Social History Narrative   Right handed   Two story home   Drinks caffeine   Social Determinants of Health   Financial Resource Strain: Not on file  Food Insecurity: Not on file  Transportation Needs: Not on file  Physical Activity: Not on  file  Stress: Not on file  Social Connections: Not on file     Family History: The patient's family history includes Cancer in her brother, brother, brother, daughter, and father; Colon cancer in her paternal aunt; Diabetes in her mother; Heart attack in her brother; Heart disease in her mother; Hypertension in her mother; Uterine cancer in her mother.  ROS:   Please see the history of present illness.    Never smoked.  No family history of vascular disease.  Her mother was a patient of mine who had diastolic heart failure.  All other systems reviewed and are negative.  EKGs/Labs/Other Studies Reviewed:    The following studies were reviewed today: CORONARY CALCIUM SCORE 09/2021: IMPRESSION: Total Agatston score: 74.9    [place] database percentile: 76   Aortic atherosclerosis.  No acute extra cardiac abnormality.  EKG:  EKG normal sinus rhythm, left anterior hemiblock, LVH.  Also has normal sinus rhythm with left atrial abnormality.  Recent Labs: No results found for requested labs within last 365 days.  Recent Lipid Panel No results found for: "CHOL", "TRIG", "HDL", "CHOLHDL", "VLDL", "LDLCALC", "LDLDIRECT"  Physical Exam:    VS:  BP 128/84   Pulse 69   Ht '5\' 3"'$  (1.6 m)   Wt 166 lb 6.4 oz (75.5 kg)   SpO2 98%   BMI 29.48 kg/m     Wt Readings from Last 3 Encounters:  11/11/21 166 lb 6.4 oz (75.5 kg)  05/21/21 164 lb (74.4 kg)  12/26/20 161 lb (73 kg)     GEN: Healthy appearing but slightly overweight.. No acute distress HEENT: Normal NECK: No JVD. LYMPHATICS: No lymphadenopathy CARDIAC: No murmur. RRR S4 but no S3 gallop, or edema. VASCULAR:  Normal Pulses. No bruits. RESPIRATORY:  Clear to auscultation without rales, wheezing or rhonchi  ABDOMEN: Soft, non-tender, non-distended, No pulsatile mass, MUSCULOSKELETAL: No deformity  SKIN: Warm and dry NEUROLOGIC:  Alert and oriented x 3 PSYCHIATRIC:  Normal affect   ASSESSMENT:    1. Elevated coronary artery calcium score   2. Nonspecific abnormal electrocardiogram (ECG) (EKG)   3. Hyperlipidemia LDL goal <70   4. Left anterior hemiblock   5. Primary hypertension   6. LVH (left ventricular hypertrophy)    PLAN:    In order of problems listed above:  We discussed primary/secondary prevention and the importance of lipid lowering.  Is a family history of elevated cholesterol.  LDL target should be less than 70.  Increase rosuvastatin to 40 mg/day. EKG reveals left atrial abnormality, LVH, and left anterior hemiblock.  Likely related to poorly controlled blood pressure. Most recent LDL on 20 mg of Crestor was 101.  Crestor was increased to 40 mg/day.  May have to add Zetia. Noted above Target 130/80.  Currently pressure is normal on  amlodipine.  2D Doppler echocardiogram will be done to assess for the presence or absence of LVH. 2D Doppler echocardiogram to investigate structural integrity of the heart and rule out left ventricular hypertrophy.   Overall education and awareness concerning primary/secondary risk prevention was discussed in detail: LDL less than 70, hemoglobin A1c less than 7, blood pressure target less than 130/80 mmHg, >150 minutes of moderate aerobic activity per week, avoidance of smoking, weight control (via diet and exercise), and continued surveillance/management of/for obstructive sleep apnea.    Medication Adjustments/Labs and Tests Ordered: Current medicines are reviewed at length with the patient today.  Concerns regarding medicines are outlined above.  Orders Placed This Encounter  Procedures  Lipid panel   Comprehensive metabolic panel   EKG 09-OBSJ   ECHOCARDIOGRAM COMPLETE   Meds ordered this encounter  Medications   rosuvastatin (CRESTOR) 40 MG tablet    Sig: Take 1 tablet (40 mg total) by mouth daily.    Dispense:  90 tablet    Refill:  3    Dose change.    Patient Instructions  Medication Instructions:  Your physician has recommended you make the following change in your medication:  1) INCREASE Rosuvastatin (Crestor) to '40mg'$  daily  *If you need a refill on your cardiac medications before your next appointment, please call your pharmacy*  Lab Work: In 6 weeks: fasting Lipid panel, CMET If you have labs (blood work) drawn today and your tests are completely normal, you will receive your results only by: [place] (if you have MyChart) OR A paper copy in the mail If you have any lab test that is abnormal or we need to change your treatment, we will call you to review the results.  Testing/Procedures: Your physician has requested that you have an echocardiogram. Echocardiography is a painless test that uses sound waves to create images of your heart. It provides your  doctor with information about the size and shape of your heart and how well your heart's chambers and valves are working. This procedure takes approximately one hour. There are no restrictions for this procedure.  Follow-Up: Will be determined based on lab results.  Important Information About Sugar         Signed, Sinclair Grooms, MD  11/11/2021 5:41 PM    [facility]

## 2021-11-11 ENCOUNTER — Encounter: Payer: Self-pay | Admitting: Interventional Cardiology

## 2021-11-11 ENCOUNTER — Ambulatory Visit: Payer: Medicare Other | Attending: Interventional Cardiology | Admitting: Interventional Cardiology

## 2021-11-11 VITALS — BP 128/84 | HR 69 | Ht 63.0 in | Wt 166.4 lb

## 2021-11-11 DIAGNOSIS — E785 Hyperlipidemia, unspecified: Secondary | ICD-10-CM | POA: Diagnosis not present

## 2021-11-11 DIAGNOSIS — R931 Abnormal findings on diagnostic imaging of heart and coronary circulation: Secondary | ICD-10-CM

## 2021-11-11 DIAGNOSIS — I10 Essential (primary) hypertension: Secondary | ICD-10-CM

## 2021-11-11 DIAGNOSIS — I517 Cardiomegaly: Secondary | ICD-10-CM

## 2021-11-11 DIAGNOSIS — I444 Left anterior fascicular block: Secondary | ICD-10-CM | POA: Diagnosis not present

## 2021-11-11 DIAGNOSIS — R9431 Abnormal electrocardiogram [ECG] [EKG]: Secondary | ICD-10-CM

## 2021-11-11 MED ORDER — ROSUVASTATIN CALCIUM 40 MG PO TABS: 40.0000 mg | ORAL_TABLET | Freq: Every day | ORAL | 3 refills | Status: AC

## 2021-11-11 NOTE — Patient Instructions (Addendum)
Medication Instructions:  Your physician has recommended you make the following change in your medication:  1) INCREASE Rosuvastatin (Crestor) to '40mg'$  daily  *If you need a refill on your cardiac medications before your next appointment, please call your pharmacy*  Lab Work: In 6 weeks: fasting Lipid panel, CMET If you have labs (blood work) drawn today and your tests are completely normal, you will receive your results only by: [place] (if you have MyChart) OR A paper copy in the mail If you have any lab test that is abnormal or we need to change your treatment, we will call you to review the results.  Testing/Procedures: Your physician has requested that you have an echocardiogram. Echocardiography is a painless test that uses sound waves to create images of your heart. It provides your doctor with information about the size and shape of your heart and how well your heart's chambers and valves are working. This procedure takes approximately one hour. There are no restrictions for this procedure.  Follow-Up: Will be determined based on lab results.  Important Information About Sugar

## 2021-11-17 ENCOUNTER — Emergency Department (HOSPITAL_COMMUNITY): Payer: Medicare Other

## 2021-11-17 ENCOUNTER — Encounter (HOSPITAL_COMMUNITY): Payer: Self-pay | Admitting: Emergency Medicine

## 2021-11-17 ENCOUNTER — Emergency Department (HOSPITAL_BASED_OUTPATIENT_CLINIC_OR_DEPARTMENT_OTHER): Payer: Medicare Other

## 2021-11-17 ENCOUNTER — Other Ambulatory Visit: Payer: Self-pay

## 2021-11-17 ENCOUNTER — Emergency Department (HOSPITAL_COMMUNITY)
Admission: EM | Admit: 2021-11-17 | Discharge: 2021-11-17 | Disposition: A | Discharge status: Home / Self Care | Payer: Medicare Other | Attending: Emergency Medicine | Admitting: Emergency Medicine

## 2021-11-17 DIAGNOSIS — R079 Chest pain, unspecified: Secondary | ICD-10-CM | POA: Diagnosis not present

## 2021-11-17 DIAGNOSIS — M7989 Other specified soft tissue disorders: Secondary | ICD-10-CM

## 2021-11-17 DIAGNOSIS — R0789 Other chest pain: Secondary | ICD-10-CM | POA: Diagnosis not present

## 2021-11-17 DIAGNOSIS — I10 Essential (primary) hypertension: Secondary | ICD-10-CM | POA: Diagnosis not present

## 2021-11-17 DIAGNOSIS — Z79899 Other long term (current) drug therapy: Secondary | ICD-10-CM | POA: Insufficient documentation

## 2021-11-17 DIAGNOSIS — M25512 Pain in left shoulder: Secondary | ICD-10-CM | POA: Diagnosis not present

## 2021-11-17 DIAGNOSIS — R7989 Other specified abnormal findings of blood chemistry: Secondary | ICD-10-CM | POA: Diagnosis not present

## 2021-11-17 LAB — BASIC METABOLIC PANEL
Anion gap: 8 (ref 5–15)
BUN: 9 mg/dL (ref 8–23)
CO2: 25 mmol/L (ref 22–32)
Calcium: 9.1 mg/dL (ref 8.9–10.3)
Chloride: 106 mmol/L (ref 98–111)
Creatinine, Ser: 0.67 mg/dL (ref 0.44–1.00)
GFR, Estimated: >60 mL/min (ref >60)
Glucose, Bld: 106 mg/dL — ABNORMAL HIGH (ref 70–99)
Potassium: 3.6 mmol/L (ref 3.5–5.1)
Sodium: 139 mmol/L (ref 135–145)

## 2021-11-17 LAB — CBC
HCT: 42.2 % (ref 36.0–46.0)
Hemoglobin: 13.8 g/dL (ref 12.0–15.0)
MCH: 30.1 pg (ref 26.0–34.0)
MCHC: 32.7 g/dL (ref 30.0–36.0)
MCV: 92.1 fL (ref 80.0–100.0)
Platelets: 268 10*3/uL (ref 150–400)
RBC: 4.58 MIL/uL (ref 3.87–5.11)
RDW: 12.8 % (ref 11.5–15.5)
WBC: 10.1 10*3/uL (ref 4.0–10.5)
nRBC: 0 % (ref 0.0–0.2)

## 2021-11-17 LAB — TROPONIN I (HIGH SENSITIVITY)
Troponin I (High Sensitivity): 4 ng/L (ref <18)
Troponin I (High Sensitivity): 4 ng/L (ref <18)

## 2021-11-17 LAB — D-DIMER, QUANTITATIVE: D-Dimer, Quant: 0.67 ug/mL-FEU — ABNORMAL HIGH (ref 0.00–0.50)

## 2021-11-17 MED ORDER — IOHEXOL 350 MG/ML SOLN
75.0000 mL | Freq: Once | INTRAVENOUS | Status: AC | PRN
  Administered 2021-11-17: 75 mL via INTRAVENOUS

## 2021-11-17 MED ORDER — SODIUM CHLORIDE 0.9 % IV BOLUS
500.0000 mL | Freq: Once | INTRAVENOUS | Status: AC
  Administered 2021-11-17: 500 mL via INTRAVENOUS

## 2021-11-17 NOTE — ED Provider Notes (Signed)
[facility] EMERGENCY DEPARTMENT Provider Note   CSN: [id number] Arrival date & time: 11/17/21  1134     History  Chief Complaint  Patient presents with   Chest Pain    Lynn Collins is a 76 y.o. female.  Patient is a 76 year old female who presents with chest pain.  She has a history of hypertension.  She takes amlodipine.  She says she woke up this morning with some achiness in her left chest.  It also aches in her left shoulder and down her left arm.  She did get a COVID-vaccine 5 days ago and has had some soreness in the arm related to that.  She also had a little bit of lymphadenopathy in her left axilla.  She ran a fever for couple days after the vaccine but not today.  No cough or cold symptoms.  No associated shortness of breath.  She says it hurts a little bit when she takes a deep breath.  She does not report any leg swelling.  She said last week she felt like her left leg is a little bit more swollen than her right leg but feels like it is more normal today.  No cough or cold symptoms.  No prior known heart disease.  No exertional symptoms.  She has her pain improved throughout the day as she was moving around.  She does not currently have any chest pain.       Home Medications Prior to Admission medications   Medication Sig Start Date End Date Taking? Authorizing Provider  amLODipine (NORVASC) 2.5 MG tablet Take 2.5 mg by mouth daily.    [provider]  Calcium Carbonate-Vit D-Min (CALTRATE PLUS PO) Take 1 tablet by mouth 2 (two) times daily.     [provider]  fluticasone (FLONASE) 50 MCG/ACT nasal spray Place 1 spray into both nostrils daily as needed for allergies or rhinitis.    [provider]  hydrocortisone (ANUSOL-HC) 2.5 % rectal cream APPLY RECTALLY TO THE AFFECTED AREA TWICE DAILY FOR 14 DAYS 06/22/21   Vladimir Crofts, PA-C  multivitamin [facility]) per tablet Take 1 tablet by mouth daily.      [provider]  Olopatadine HCl 0.2 % SOLN 1 drop 06/21/20   [provider]  Psyllium (METAMUCIL PO) Take by mouth every other day.    [provider]  rosuvastatin (CRESTOR) 40 MG tablet Take 1 tablet (40 mg total) by mouth daily. 11/11/21   Belva Crome, MD  SYNTHROID 112 MCG tablet Take 112 mcg by mouth every morning. 10/15/21   [provider]      Allergies    Sodium fluoride, Sulfa antibiotics, Tramadol hcl, Kapidex [dexlansoprazole], and Sulfonamide derivatives    Review of Systems   Review of Systems  Constitutional:  Negative for chills, diaphoresis, fatigue and fever.  HENT:  Negative for congestion, rhinorrhea and sneezing.   Eyes: Negative.   Respiratory:  Negative for cough, chest tightness and shortness of breath.   Cardiovascular:  Positive for chest pain. Negative for leg swelling.  Gastrointestinal:  Negative for abdominal pain, blood in stool, diarrhea, nausea and vomiting.  Genitourinary:  Negative for difficulty urinating, flank pain, frequency and hematuria.  Musculoskeletal:  Positive for arthralgias. Negative for back pain.  Skin:  Negative for rash.  Neurological:  Negative for dizziness, speech difficulty, weakness, numbness and headaches.    Physical Exam Updated Vital Signs BP (!) 149/76   Pulse 73  Temp 98.3 F (36.8 C) (Oral)   Resp 16   Ht '5\' 3"'$  (1.6 m)   Wt 75 kg   SpO2 95%   BMI 29.29 kg/m  Physical Exam Constitutional:      Appearance: She is well-developed.  HENT:     Head: Normocephalic and atraumatic.  Eyes:     Pupils: Pupils are equal, round, and reactive to light.  Cardiovascular:     Rate and Rhythm: Normal rate and regular rhythm.     Heart sounds: Normal heart sounds.  Pulmonary:     Effort: Pulmonary effort is normal. No respiratory distress.     Breath sounds: Normal breath sounds. No wheezing or rales.  Chest:     Chest wall: No tenderness.  Abdominal:     General: Bowel sounds are normal.      Palpations: Abdomen is soft.     Tenderness: There is no abdominal tenderness. There is no guarding or rebound.  Musculoskeletal:        General: Normal range of motion.     Cervical back: Normal range of motion and neck supple.     Comments: No edema or calf tenderness  Lymphadenopathy:     Cervical: No cervical adenopathy.  Skin:    General: Skin is warm and dry.     Findings: No rash.  Neurological:     Mental Status: She is alert and oriented to person, place, and time.     ED Results / Procedures / Treatments   Labs (all labs ordered are listed, but only abnormal results are displayed) Labs Reviewed  BASIC METABOLIC PANEL - Abnormal; Notable for the following components:      Result Value   Glucose, Bld 106 (*)    All other components within normal limits  D-DIMER, QUANTITATIVE - Abnormal; Notable for the following components:   D-Dimer, Quant 0.67 (*)    All other components within normal limits  CBC  TROPONIN I (HIGH SENSITIVITY)  TROPONIN I (HIGH SENSITIVITY)    EKG EKG Interpretation  Date/Time:  Sunday November 17 2021 16:54:49 EDT Ventricular Rate:  62 PR Interval:  172 QRS Duration: 114 QT Interval:  440 QTC Calculation: 446 R Axis:   -27 Text Interpretation: Normal sinus rhythm with sinus arrhythmia Moderate voltage criteria for LVH, may be normal variant ( R in aVL , Cornell product ) Septal infarct , age undetermined Abnormal ECG When compared with ECG of 17-Nov-2021 11:40, PREVIOUS ECG IS PRESENT Confirmed by Malvin Johns [phone]) on 11/17/2021 5:20:47 PM  Radiology CT Angio Chest PE W/Cm &/Or Wo Cm  Result Date: 11/17/2021 CLINICAL DATA:  Left-sided chest pain radiating to the left shoulder positive D-dimer; PE suspected; recent COVID vaccination EXAM: CT ANGIOGRAPHY CHEST WITH CONTRAST TECHNIQUE: Multidetector CT imaging of the chest was performed using the standard protocol during bolus administration of intravenous contrast. Multiplanar CT image  reconstructions and MIPs were obtained to evaluate the vascular anatomy. RADIATION DOSE REDUCTION: This exam was performed according to the departmental dose-optimization program which includes automated exposure control, adjustment of the mA and/or kV according to patient size and/or use of iterative reconstruction technique. CONTRAST:  55m OMNIPAQUE IOHEXOL 350 MG/ML SOLN COMPARISON:  Radiographs earlier today and CT 10/01/2021 FINDINGS: Cardiovascular: Satisfactory opacification of the pulmonary arteries to the segmental level. No evidence of pulmonary embolism. No pericardial effusion. Aortic and coronary artery atherosclerotic calcification. Mediastinum/Nodes: Prominent left axillary lymph nodes are indeterminate but may be related to recent vaccination. These measure up to  1.1 cm in short axis. No mediastinal or hilar adenopathy. Calcified right thyroid nodule measuring 1.5 cm. No follow-up required. Unremarkable esophagus. Lungs/Pleura: No focal consolidation, pleural effusion, or pneumothorax. Mild diffuse bronchial wall thickening. Mosaic attenuation compatible with mild air trapping. Upper Abdomen: No acute abnormality.  Cholecystectomy. Musculoskeletal: Advanced degenerative changes at C6-C7. No acute osseous abnormality. Review of the MIP images confirms the above findings. IMPRESSION: 1. Negative for acute pulmonary embolism. 2. Mild bronchial wall thickening and air trapping can be seen with small airways disease. 3. Mild left axillary adenopathy likely related to recent vaccination. 4.  Aortic Atherosclerosis (ICD10-I70.0). Electronically Signed   By: Placido Sou M.D.   On: 11/17/2021 19:16   VAS [place] LOWER EXTREMITY VENOUS (DVT) (7a-7p)  Result Date: 11/17/2021  Lower Venous DVT Study Patient Name:  Lynn Collins  Date of Exam:   11/17/2021 Medical Rec #: [id number]           Accession #:    [id number] Date of Birth: February 06, 1946           Patient Gender: F Patient Age:   71 years Exam  Location:  [facility] Procedure:      VAS [place] LOWER EXTREMITY VENOUS (DVT) Referring Phys: Gerado Nabers --------------------------------------------------------------------------------  Indications: Swelling, and Mildly elevated D-Dimer.  Comparison Study: No prior study on file Performing Technologist: Sharion Dove RVS  Examination Guidelines: A complete evaluation includes B-mode imaging, spectral Doppler, color Doppler, and power Doppler as needed of all accessible portions of each vessel. Bilateral testing is considered an integral part of a complete examination. Limited examinations for reoccurring indications may be performed as noted. The reflux portion of the exam is performed with the patient in reverse Trendelenburg.  +---------+---------------+---------+-----------+----------+--------------+ RIGHT    CompressibilityPhasicitySpontaneityPropertiesThrombus Aging +---------+---------------+---------+-----------+----------+--------------+ CFV      Full           Yes      Yes                                 +---------+---------------+---------+-----------+----------+--------------+ SFJ      Full                                                        +---------+---------------+---------+-----------+----------+--------------+ FV Prox  Full                                                        +---------+---------------+---------+-----------+----------+--------------+ FV Mid   Full                                                        +---------+---------------+---------+-----------+----------+--------------+ FV DistalFull                                                        +---------+---------------+---------+-----------+----------+--------------+  PFV      Full                                                        +---------+---------------+---------+-----------+----------+--------------+ POP      Full           Yes      Yes                                  +---------+---------------+---------+-----------+----------+--------------+ PTV      Full                                                        +---------+---------------+---------+-----------+----------+--------------+ PERO     Full                                                        +---------+---------------+---------+-----------+----------+--------------+   +---------+---------------+---------+-----------+----------+--------------+ LEFT     CompressibilityPhasicitySpontaneityPropertiesThrombus Aging +---------+---------------+---------+-----------+----------+--------------+ CFV      Full           Yes      Yes                                 +---------+---------------+---------+-----------+----------+--------------+ SFJ      Full                                                        +---------+---------------+---------+-----------+----------+--------------+ FV Prox  Full                                                        +---------+---------------+---------+-----------+----------+--------------+ FV Mid   Full                                                        +---------+---------------+---------+-----------+----------+--------------+ FV DistalFull                                                        +---------+---------------+---------+-----------+----------+--------------+ PFV      Full                                                        +---------+---------------+---------+-----------+----------+--------------+  POP      Full           Yes      Yes                                 +---------+---------------+---------+-----------+----------+--------------+ PTV      Full                                                        +---------+---------------+---------+-----------+----------+--------------+ PERO     Full                                                         +---------+---------------+---------+-----------+----------+--------------+     Summary: BILATERAL: - No evidence of deep vein thrombosis seen in the lower extremities, bilaterally. -No evidence of popliteal cyst, bilaterally.   *See table(s) above for measurements and observations.    Preliminary    DG Chest 2 View  Result Date: 11/17/2021 CLINICAL DATA:  Left-sided chest pain.  Shoulder pain. EXAM: CHEST - 2 VIEW COMPARISON:  April 02, 2015 FINDINGS: The heart size and mediastinal contours are within normal limits. Both lungs are clear. The visualized skeletal structures are unremarkable. IMPRESSION: No active cardiopulmonary disease. Electronically Signed   By: Dorise Bullion III M.D.   On: 11/17/2021 12:23    Procedures Procedures    Medications Ordered in ED Medications  sodium chloride 0.9 % bolus 500 mL (has no administration in time range)  iohexol (OMNIPAQUE) 350 MG/ML injection 75 mL (75 mLs Intravenous Contrast Given 11/17/21 1907)    ED Course/ Medical Decision Making/ A&P                           Medical Decision Making Amount and/or Complexity of Data Reviewed Labs: ordered. Radiology: ordered.  Risk Prescription drug management.   Patient is a 76 year old who presents with left-sided chest and shoulder pain.  She has no exertional symptoms.  No current symptoms.  No associated shortness of breath.  She had a little bit of pleuritic type pain.  D-dimer was mildly elevated.  CT scan was performed which shows no evidence of PE.  Chest x-ray was performed which is interpreted by me and confirmed by the radiologist that show no evidence of pneumonia or pulmonary edema.  CT scan confirms this.  Her EKG does not show any ischemic changes.  She has had 2 negative troponins.  Her symptoms are not consistent with aortic dissection.  She is currently symptom-free.  She was discharged home in good condition.  She will contact her cardiologist Dr. Tamala Julian tomorrow.  Return precautions  were given.   Final Clinical Impression(s) / ED Diagnoses Final diagnoses:  Nonspecific chest pain    Rx / DC Orders ED Discharge Orders     None         Malvin Johns, MD 11/17/21 1939

## 2021-11-17 NOTE — Progress Notes (Signed)
VASCULAR LAB    Bilateral lower extremity venous duplex has been performed.  See CV proc for preliminary results.  Messaged negative results to Dr. Tamera Punt via secure chat  Sharion Dove, RVT 11/17/2021, 6:43 PM

## 2021-11-17 NOTE — ED Triage Notes (Signed)
Patient here for concern of chest pain left sided upon waking up this AM at 0730 that is radiating to the L shoulder. Patient denies SHOB, abdominal pain, n/v/d. Patient states she received her COVID vaccination to that left side a few days ago and was sore afterwards but it since went away and now this chest pain has started.

## 2021-11-18 ENCOUNTER — Telehealth: Payer: Self-pay | Admitting: Interventional Cardiology

## 2021-11-18 NOTE — Telephone Encounter (Signed)
  Pt was in the ED yesterday due to CP. She made an appt for ED f/u with Lynn Collins on 11/28/21 after her echo. She also want to ask Dr. Tamala Julian if he can take a look of her ED records for any recommendations

## 2021-11-20 NOTE — Telephone Encounter (Signed)
Spoke with the patient and gave advise from Dr. Tamala Julian. Patient has Ca score done already ordered by her PCP and was reason for referral. She is scheduled for an echocardiogram next week.

## 2021-11-21 DIAGNOSIS — E042 Nontoxic multinodular goiter: Secondary | ICD-10-CM | POA: Diagnosis not present

## 2021-11-21 DIAGNOSIS — E039 Hypothyroidism, unspecified: Secondary | ICD-10-CM | POA: Diagnosis not present

## 2021-11-21 DIAGNOSIS — E559 Vitamin D deficiency, unspecified: Secondary | ICD-10-CM | POA: Diagnosis not present

## 2021-11-22 ENCOUNTER — Other Ambulatory Visit (HOSPITAL_COMMUNITY): Payer: Medicare Other

## 2021-11-26 NOTE — Progress Notes (Deleted)
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  Advised to wear wrist splints at night when she sleeps and as much during the day as possible.  If symptoms worsen, we can consider further testing such as NCV-EMG or cervical spine imaging.  Follow up.    Subjective:  Lynn Collins is a 76 year old female with HTN, hypothyroidism and history of kidney stone who follows up for cervical radiculopathy.  UPDATE: ***  HISTORY: She has a history of neck injury as a child with subsequent flare ups of neck pain over the years.  She was treated for neck pain in 1997 and 2007.  MRI of cervical spine from 05/26/2005 showed primarily left sided facet hypertrophy causing mild foraminal narrowing at C4-5, C5-6 and C6-7.  Cervical spine X-ray from 11/17/2014 showed diffuse degenerative changes with prominent disc space loss and endplate osteophyte formation at C5-6, C6-7 and C7-T1.  In January 2023, she was walking her dog when the dog suddenly pulled her hard forward with the leash.  Afterwards, she developed bilateral shoulder and upper arm pain (right worse than left) as well as numbness and tingling in the fingers.  For about a day, she also noticed some numbness on the left side of her mouth.  No weakness.  She initially treated with  meloxicam but it made her nauseous.  She started using Motrin and cervical collar.  She has improved.  Neck and shoulder pain resolved.  She still notes some numbness and tingling in the fingers, particularly the last two fingers of each hand.  She mostly notices it when she wakes up in the morning in bed and needs to shake it out.     PAST MEDICAL HISTORY: Past Medical History:  Diagnosis Date   Arthritis    Atrophic vaginitis     Diverticulitis    Exposure to TB    possible per pt   Hemorrhoids    Hypertension    Hypothyroidism    Nodule   Kidney stone    Osteopenia 12/2018   T score -1.8 FRAX 9.1% / 4.2%   Rectocele    Varicose veins     MEDICATIONS: Current Outpatient Medications on File Prior to Visit  Medication Sig Dispense Refill   amLODipine (NORVASC) 2.5 MG tablet Take 2.5 mg by mouth daily.     Calcium Carbonate-Vit D-Min (CALTRATE PLUS PO) Take 1 tablet by mouth 2 (two) times daily.      fluticasone (FLONASE) 50 MCG/ACT nasal spray Place 1 spray into both nostrils daily as needed for allergies or rhinitis.     hydrocortisone (ANUSOL-HC) 2.5 % rectal cream APPLY RECTALLY TO THE AFFECTED AREA TWICE DAILY FOR 14 DAYS 30 g 1   multivitamin (THERAGRAN) per tablet Take 1 tablet by mouth daily.       Olopatadine HCl 0.2 % SOLN 1 drop     Psyllium (METAMUCIL PO) Take by mouth every other day.     rosuvastatin (CRESTOR) 40 MG tablet Take 1 tablet (40 mg total) by mouth daily. 90 tablet 3   SYNTHROID 112 MCG tablet Take 112 mcg by mouth every morning.     No current facility-administered medications on file prior to visit.    ALLERGIES: Allergies  Allergen Reactions   Sodium Fluoride Other (See Comments)  Sulfa Antibiotics Other (See Comments)   Tramadol Hcl Other (See Comments)   Kapidex [Dexlansoprazole] Rash   Sulfonamide Derivatives Rash    FAMILY HISTORY: Family History  Problem Relation Age of Onset   Diabetes Mother    Hypertension Mother    Uterine cancer Mother    Heart disease Mother    Cancer Father        prostate   Colon cancer Paternal Aunt    Heart attack Brother    Cancer Brother        Prostate   Cancer Daughter        Thyroid cancer   Cancer Brother        Prostate   Cancer Brother        Prostate      Objective:  *** General: No acute distress.  Patient appears well-groomed.   Head:  Normocephalic/atraumatic Eyes:  Fundi examined but not visualized Neck:  supple, no paraspinal tenderness, full range of motion Neurological Exam: ***   Metta Clines, DO  CC: C. Melinda Crutch, MD

## 2021-11-27 ENCOUNTER — Ambulatory Visit (HOSPITAL_COMMUNITY): Payer: Medicare Other | Attending: Interventional Cardiology

## 2021-11-27 DIAGNOSIS — R9431 Abnormal electrocardiogram [ECG] [EKG]: Secondary | ICD-10-CM | POA: Diagnosis not present

## 2021-11-28 ENCOUNTER — Ambulatory Visit: Payer: Medicare Other | Admitting: Nurse Practitioner

## 2021-11-28 LAB — ECHOCARDIOGRAM COMPLETE
Area-P 1/2: 3.37 cm2
S' Lateral: 2.3 cm

## 2021-11-29 ENCOUNTER — Ambulatory Visit: Payer: Medicare Other | Admitting: Neurology

## 2021-12-15 DIAGNOSIS — Z23 Encounter for immunization: Secondary | ICD-10-CM | POA: Diagnosis not present

## 2021-12-23 ENCOUNTER — Ambulatory Visit: Payer: Medicare Other | Attending: Interventional Cardiology

## 2021-12-23 DIAGNOSIS — E785 Hyperlipidemia, unspecified: Secondary | ICD-10-CM | POA: Diagnosis not present

## 2021-12-24 LAB — COMPREHENSIVE METABOLIC PANEL
ALT: 19 IU/L (ref 0–32)
AST: 23 IU/L (ref 0–40)
Albumin/Globulin Ratio: 1.2 (ref 1.2–2.2)
Albumin: 4.1 g/dL (ref 3.8–4.8)
Alkaline Phosphatase: 74 IU/L (ref 44–121)
BUN/Creatinine Ratio: 16 (ref 12–28)
BUN: 12 mg/dL (ref 8–27)
Bilirubin Total: 0.5 mg/dL (ref 0.0–1.2)
CO2: 28 mmol/L (ref 20–29)
Calcium: 9.5 mg/dL (ref 8.7–10.3)
Chloride: 104 mmol/L (ref 96–106)
Creatinine, Ser: 0.73 mg/dL (ref 0.57–1.00)
Globulin, Total: 3.3 g/dL (ref 1.5–4.5)
Glucose: 97 mg/dL (ref 70–99)
Potassium: 3.9 mmol/L (ref 3.5–5.2)
Sodium: 141 mmol/L (ref 134–144)
Total Protein: 7.4 g/dL (ref 6.0–8.5)
eGFR: 85 mL/min/{1.73_m2} (ref >59)

## 2021-12-24 LAB — LIPID PANEL
Chol/HDL Ratio: 2.6 ratio (ref 0.0–4.4)
Cholesterol, Total: 116 mg/dL (ref 100–199)
HDL: 45 mg/dL (ref >39)
LDL Chol Calc (NIH): 56 mg/dL (ref 0–99)
Triglycerides: 75 mg/dL (ref 0–149)
VLDL Cholesterol Cal: 15 mg/dL (ref 5–40)

## 2022-01-01 ENCOUNTER — Other Ambulatory Visit (HOSPITAL_COMMUNITY)
Admission: RE | Admit: 2022-01-01 | Discharge: 2022-01-01 | Disposition: A | Discharge status: Home / Self Care | Payer: Medicare Other | Source: Ambulatory Visit | Attending: Obstetrics and Gynecology | Admitting: Obstetrics and Gynecology

## 2022-01-01 ENCOUNTER — Ambulatory Visit (INDEPENDENT_AMBULATORY_CARE_PROVIDER_SITE_OTHER): Payer: Medicare Other | Admitting: Obstetrics and Gynecology

## 2022-01-01 ENCOUNTER — Encounter: Payer: Self-pay | Admitting: Obstetrics and Gynecology

## 2022-01-01 VITALS — BP 134/80 | HR 64 | Ht 62.0 in | Wt 164.0 lb

## 2022-01-01 DIAGNOSIS — Z1151 Encounter for screening for human papillomavirus (HPV): Secondary | ICD-10-CM | POA: Diagnosis not present

## 2022-01-01 DIAGNOSIS — N816 Rectocele: Secondary | ICD-10-CM

## 2022-01-01 DIAGNOSIS — N9089 Other specified noninflammatory disorders of vulva and perineum: Secondary | ICD-10-CM | POA: Diagnosis not present

## 2022-01-01 DIAGNOSIS — R931 Abnormal findings on diagnostic imaging of heart and coronary circulation: Secondary | ICD-10-CM

## 2022-01-01 DIAGNOSIS — M8589 Other specified disorders of bone density and structure, multiple sites: Secondary | ICD-10-CM

## 2022-01-01 DIAGNOSIS — Z124 Encounter for screening for malignant neoplasm of cervix: Secondary | ICD-10-CM | POA: Insufficient documentation

## 2022-01-01 DIAGNOSIS — Z01419 Encounter for gynecological examination (general) (routine) without abnormal findings: Secondary | ICD-10-CM | POA: Insufficient documentation

## 2022-01-01 LAB — VITAMIN D 25 HYDROXY (VIT D DEFICIENCY, FRACTURES): Vit D, 25-Hydroxy: 62 ng/mL (ref 30–100)

## 2022-01-01 MED ORDER — ALENDRONATE SODIUM 70 MG PO TABS: 70.0000 mg | ORAL_TABLET | ORAL | 2 refills | Status: DC

## 2022-01-01 NOTE — Patient Instructions (Addendum)
EXERCISE AND DIET:  We recommended that you start or continue a regular exercise program for good health. Regular exercise means any activity that makes your heart beat faster and makes you sweat.  We recommend exercising at least 30 minutes per day at least 3 days a week, preferably 4 or 5.  We also recommend a diet low in fat and sugar.  Inactivity, poor dietary choices and obesity can cause diabetes, heart attack, stroke, and kidney damage, among others.    ALCOHOL AND SMOKING:  Women should limit their alcohol intake to no more than 7 drinks/beers/glasses of wine (combined, not each!) per week. Moderation of alcohol intake to this level decreases your risk of breast cancer and liver damage. And of course, no recreational drugs are part of a healthy lifestyle.  And absolutely no smoking or even second hand smoke. Most people know smoking can cause heart and lung diseases, but did you know it also contributes to weakening of your bones? Aging of your skin?  Yellowing of your teeth and nails?  CALCIUM AND VITAMIN D:  Adequate intake of calcium and Vitamin D are recommended.  The recommendations for exact amounts of these supplements seem to change often, but generally speaking 600 mg of calcium (either carbonate or citrate) and 800 units of Vitamin D per day seems prudent. Certain women may benefit from higher intake of Vitamin D.  If you are among these women, your doctor will have told you during your visit.    PAP SMEARS:  Pap smears, to check for cervical cancer or precancers,  have traditionally been done yearly, although recent scientific advances have shown that most women can have pap smears less often.  However, every woman still should have a physical exam from her gynecologist every year. It will include a breast check, inspection of the vulva and vagina to check for abnormal growths or skin changes, a visual exam of the cervix, and then an exam to evaluate the size and shape of the uterus and  ovaries.  And after 77 years of age, a rectal exam is indicated to check for rectal cancers. We will also provide age appropriate advice regarding health maintenance, like when you should have certain vaccines, screening for sexually transmitted diseases, bone density testing, colonoscopy, mammograms, etc.   MAMMOGRAMS:  All women over 40 years old should have a yearly mammogram. Many facilities now offer a "3D" mammogram, which may cost around $50 extra out of pocket. If possible,  we recommend you accept the option to have the 3D mammogram performed.  It both reduces the number of women who will be called back for extra views which then turn out to be normal, and it is better than the routine mammogram at detecting truly abnormal areas.    COLONOSCOPY:  Colonoscopy to screen for colon cancer is recommended for all women at age 50.  We know, you hate the idea of the prep.  We agree, BUT, having colon cancer and not knowing it is worse!!  Colon cancer so often starts as a polyp that can be seen and removed at colonscopy, which can quite literally save your life!  And if your first colonoscopy is normal and you have no family history of colon cancer, most women don't have to have it again for 10 years.  Once every ten years, you can do something that may end up saving your life, right?  We will be happy to help you get it scheduled when you are ready.    Be sure to check your insurance coverage so you understand how much it will cost.  It may be covered as a preventative service at no cost, but you should check your particular policy.    Calcium Content in Foods Calcium is the most abundant mineral in the body. Most of the body's calcium supply is stored in bones and teeth. Calcium helps many parts of the body function normally, including: Blood and blood vessels. Nerves. Hormones. Muscles. Bones and teeth. When your calcium stores are low, you may be at risk for low bone mass, bone loss, and broken bones  (fractures). When you get enough calcium, it helps to support strong bones and teeth throughout your life. Calcium is especially important for: Children during growth spurts. Girls during adolescence. Women who are pregnant or breastfeeding. Women after their menstrual cycle stops (postmenopause). Women whose menstrual cycle has stopped due to anorexia nervosa or regular intense exercise. People who cannot eat or digest dairy products. Vegans. Recommended daily amounts of calcium: Women (ages 19 to 50): 1,000 mg per day. Women (ages 51 and older): 1,200 mg per day. Men (ages 19 to 70): 1,000 mg per day. Men (ages 71 and older): 1,200 mg per day. Women (ages 9 to 18): 1,300 mg per day. Men (ages 9 to 18): 1,300 mg per day. General information Eat foods that are high in calcium. Try to get most of your calcium from food. Some people may benefit from taking calcium supplements. Check with your health care provider or diet and nutrition specialist (dietitian) before starting any calcium supplements. Calcium supplements may interact with certain medicines. Too much calcium may cause other health problems, such as constipation and kidney stones. For the body to absorb calcium, it needs vitamin D. Sources of vitamin D include: Skin exposure to direct sunlight. Foods, such as egg yolks, liver, mushrooms, saltwater fish, and fortified milk. Vitamin D supplements. Check with your health care provider or dietitian before starting any vitamin D supplements. What foods are high in calcium?  Foods that are high in calcium contain more than 100 milligrams per serving. Fruits Fortified orange juice or other fruit juice, 300 mg per 8 oz serving. Vegetables Collard greens, 360 mg per 8 oz serving. Kale, 100 mg per 8 oz serving. Bok choy, 160 mg per 8 oz serving. Grains Fortified ready-to-eat cereals, 100 to 1,000 mg per 8 oz serving. Fortified frozen waffles, 200 mg in 2 waffles. Oatmeal, 140 mg in  1 cup. Meats and other proteins Sardines, canned with bones, 325 mg per 3 oz serving. Salmon, canned with bones, 180 mg per 3 oz serving. Canned shrimp, 125 mg per 3 oz serving. Baked beans, 160 mg per 4 oz serving. Tofu, firm, made with calcium sulfate, 253 mg per 4 oz serving. Dairy Yogurt, plain, low-fat, 310 mg per 6 oz serving. Nonfat milk, 300 mg per 8 oz serving. American cheese, 195 mg per 1 oz serving. Cheddar cheese, 205 mg per 1 oz serving. Cottage cheese 2%, 105 mg per 4 oz serving. Fortified soy, rice, or almond milk, 300 mg per 8 oz serving. Mozzarella, part skim, 210 mg per 1 oz serving. The items listed above may not be a complete list of foods high in calcium. Actual amounts of calcium may be different depending on processing. Contact a dietitian for more information. What foods are lower in calcium? Foods that are lower in calcium contain 50 mg or less per serving. Fruits Apple, about 6 mg. Banana, about 12 mg.   Vegetables Lettuce, 19 mg per 2 oz serving. Tomato, about 11 mg. Grains Rice, 4 mg per 6 oz serving. Boiled potatoes, 14 mg per 8 oz serving. White bread, 6 mg per slice. Meats and other proteins Egg, 27 mg per 2 oz serving. Red meat, 7 mg per 4 oz serving. Chicken, 17 mg per 4 oz serving. Fish, cod, or trout, 20 mg per 4 oz serving. Dairy Cream cheese, regular, 14 mg per 1 Tbsp serving. Brie cheese, 50 mg per 1 oz serving. Parmesan cheese, 70 mg per 1 Tbsp serving. The items listed above may not be a complete list of foods lower in calcium. Actual amounts of calcium may be different depending on processing. Contact a dietitian for more information. Summary Calcium is an important mineral in the body because it affects many functions. Getting enough calcium helps support strong bones and teeth throughout your life. Try to get most of your calcium from food. Calcium supplements may interact with certain medicines. Check with your health care provider  or dietitian before starting any calcium supplements. This information is not intended to replace advice given to you by your health care provider. Make sure you discuss any questions you have with your health care provider. Document Revised: 06/08/2019 Document Reviewed: 06/08/2019 Elsevier Patient Education  [place].  Alendronate Tablets What is this medication? ALENDRONATE (a LEN droe nate) prevents and treats osteoporosis. It may also be used to treat Paget disease of the bone. It works by Paramedic stronger and less likely to break (fracture). It belongs to a group of medications called bisphosphonates. This medicine may be used for other purposes; ask your health care provider or pharmacist if you have questions. COMMON BRAND NAME(S): Fosamax What should I tell my care team before I take this medication? They need to know if you have any of these conditions: Bleeding disorder Cancer Dental disease Difficulty swallowing Infection (fever, chills, cough, sore throat, pain or trouble passing urine) Kidney disease Low levels of calcium or other minerals in the blood Low red blood cell counts Receiving steroids like dexamethasone or prednisone Stomach or intestine problems Trouble sitting or standing for 30 minutes An unusual or allergic reaction to alendronate, other medications, foods, dyes or preservatives Pregnant or trying to get pregnant Breast-feeding How should I use this medication? Take this medication by mouth with a full glass of water. Take it as directed on the prescription label at the same time every day. Take the dose right after waking up. Do not eat or drink anything before taking it. Do not take it with any other drink except water. Do not chew or crush the tablet. After taking it, do not eat breakfast, drink, or take any other medications or vitamins for at least 30 minutes. Sit or stand up for at least 30 minutes after you take it. Do not lie down.  Keep taking it unless your care team tells you to stop. A special MedGuide will be given to you by the pharmacist with each prescription and refill. Be sure to read this information carefully each time. Talk to your care team about the use of this medication in children. Special care may be needed. Overdosage: If you think you have taken too much of this medicine contact a poison control center or emergency room at once. NOTE: This medicine is only for you. Do not share this medicine with others. What if I miss a dose? If you take your medication once a day, skip  it. Take your next dose at the scheduled time the next morning. Do not take two doses on the same day. If you take your medication once a week, take the missed dose on the morning after you remember. Do not take two doses on the same day. What may interact with this medication? Aluminum hydroxide Antacids Aspirin Calcium supplements Medications for inflammation like ibuprofen, naproxen, and others Iron supplements Magnesium supplements Vitamins with minerals This list may not describe all possible interactions. Give your health care provider a list of all the medicines, herbs, non-prescription drugs, or dietary supplements you use. Also tell them if you smoke, drink alcohol, or use illegal drugs. Some items may interact with your medicine. What should I watch for while using this medication? Visit your care team for regular checks on your progress. It may be some time before you see the benefit from this medication. Some people who take this medication have severe bone, joint, or muscle pain. This medication may also increase your risk for jaw problems or a broken thigh bone. Tell your care team right away if you have severe pain in your jaw, bones, joints, or muscles. Tell you care team if you have any pain that does not go away or that gets worse. Tell your dentist and dental surgeon that you are taking this medication. You should not  have major dental surgery while on this medication. See your dentist to have a dental exam and fix any dental problems before starting this medication. Take good care of your teeth while on this medication. Make sure you see your dentist for regular follow-up appointments. You should make sure you get enough calcium and vitamin D while you are taking this medication. Discuss the foods you eat and the vitamins you take with your care team. You may need blood work done while you are taking this medication. What side effects may I notice from receiving this medication? Side effects that you should report to your care team as soon as possible: Allergic reactions--skin rash, itching, hives, swelling of the face, lips, tongue, or throat Low calcium level--muscle pain or cramps, confusion, tingling, or numbness in the hands or feet Osteonecrosis of the jaw--pain, swelling, or redness in the mouth, numbness of the jaw, poor healing after dental work, unusual discharge from the mouth, visible bones in the mouth Pain or trouble swallowing Severe bone, joint, or muscle pain Stomach bleeding--bloody or black, tar-like stools, vomiting blood or brown material that looks like coffee grounds Side effects that usually do not require medical attention (report to your care team if they continue or are bothersome): Constipation Diarrhea Nausea Stomach pain This list may not describe all possible side effects. Call your doctor for medical advice about side effects. You may report side effects to FDA at 1-800-FDA-1088. Where should I keep my medication? Keep out of the reach of children and pets. Store at room temperature between 15 and 30 degrees C (59 and 86 degrees F). Throw away any unused medication after the expiration date. NOTE: This sheet is a summary. It may not cover all possible information. If you have questions about this medicine, talk to your doctor, pharmacist, or health care provider.  2023  Elsevier/Gold Standard (2020-02-06 00:00:00)

## 2022-01-01 NOTE — Progress Notes (Unsigned)
76 y.o. [id number] Married [place] American female here for an office visit.   Patient is followed for a second degree rectocele, hx left ovarian fibroma, and osteopenia.  Saw GI some months ago, and she received recommendation for Metamucil.   She takes vit D 5000 IU daily.  Mother had a hip fracture.  Hx renal stones.  Her last pelvic US was 07/20/19 for LLQ pain and was noted to have a normal uterus and normal bilateral ovaries with benign calcifications.  She has seen GYN ONC in the past.   Taking medication to control her cholesterol through cardiology.  PCP:   Lawerance Cruel, MD  No LMP recorded. Patient is postmenopausal.           Sexually active: No.  The current method of family planning is post menopausal status.    Exercising: Yes.    Home exercise routine includes walking 1 hrs per day.  Gardening.  Smoker:  no  Health Maintenance: Pap:  12-13-19 Neg, 10-02-16 Neg, 08-25-13  History of abnormal Pap:  no MMG:  05/22/21 - BIRADS1 - [facility].  Colonoscopy: 1/27 2014 BMD:  02/12/2021 osteopenia of hip and spine.  FRAX:  9.4% overall risk and 4.8% hip fracture risk.  TDaP:  unknown Gardasil:   no Screening Labs:  PCP   reports that she has never smoked. She has never used smokeless tobacco. She reports that she does not currently use alcohol. She reports that she does not use drugs.  Past Medical History:  Diagnosis Date   Arthritis    Atrophic vaginitis    Diverticulitis    Exposure to TB    possible per pt   Hemorrhoids    Hypertension    Hypothyroidism    Nodule   Kidney stone    Osteopenia 12/2018   T score -1.8 FRAX 9.1% / 4.2%   Rectocele    Varicose veins     Past Surgical History:  Procedure Laterality Date   CHOLECYSTECTOMY     Thyroid nodule Bx     Benign   TUBAL LIGATION      Current Outpatient Medications  Medication Sig Dispense Refill   amLODipine (NORVASC) 2.5 MG tablet Take 2.5 mg by mouth daily.     Calcium Carbonate-Vit D-Min  (CALTRATE PLUS PO) Take 1 tablet by mouth 2 (two) times daily.      fluticasone (FLONASE) 50 MCG/ACT nasal spray Place 1 spray into both nostrils daily as needed for allergies or rhinitis.     hydrocortisone (ANUSOL-HC) 2.5 % rectal cream APPLY RECTALLY TO THE AFFECTED AREA TWICE DAILY FOR 14 DAYS 30 g 1   multivitamin (THERAGRAN) per tablet Take 1 tablet by mouth daily.       Olopatadine HCl 0.2 % SOLN 1 drop     Psyllium (METAMUCIL PO) Take by mouth every other day.     rosuvastatin (CRESTOR) 40 MG tablet Take 1 tablet (40 mg total) by mouth daily. 90 tablet 3   SYNTHROID 112 MCG tablet Take 112 mcg by mouth every morning.     No current facility-administered medications for this visit.    Family History  Problem Relation Age of Onset   Diabetes Mother    Hypertension Mother    Uterine cancer Mother    Heart disease Mother    Cancer Father        prostate   Colon cancer Paternal Aunt    Heart attack Brother    Cancer Brother  Prostate   Cancer Daughter        Thyroid cancer   Cancer Brother        Prostate   Cancer Brother        Prostate    Review of Systems  All other systems reviewed and are negative.   Exam:   BP 134/80 (BP Location: Right Arm, Patient Position: Sitting, Cuff Size: Normal)   Ht '5\' 2"'$  (1.575 m)   Wt 164 lb (74.4 kg)   BMI 30.00 kg/m     General appearance: alert, cooperative and appears stated age Head: normocephalic, without obvious abnormality, atraumatic Neck: no adenopathy, supple, symmetrical, trachea midline and thyroid normal to inspection and palpation Lungs: clear to auscultation bilaterally Breasts: normal appearance, no masses or tenderness, No nipple retraction or dimpling, No nipple discharge or bleeding, No axillary adenopathy Heart: regular rate and rhythm Abdomen: soft, non-tender; no masses, no organomegaly Extremities: extremities normal, atraumatic, no cyanosis or edema Skin: skin color, texture, turgor normal. No rashes  or lesions Lymph nodes: cervical, supraclavicular, and axillary nodes normal. Neurologic: grossly normal  Pelvic: External genitalia:ring of erythema  and spots of erythema of the vagina.               No abnormal inguinal nodes palpated.              Urethra:  normal appearing urethra with no masses, tenderness or lesions              Bartholins and Skenes: normal                 Vagina: normal appearing vagina with normal color and discharge, no lesions              Cervix: no lesions              Pap taken: {yes no:314532} Bimanual Exam:  Uterus:  normal size, contour, position, consistency, mobility, non-tender              Adnexa: no mass, fullness, tenderness              Rectal exam: {yes no:314532}.  Confirms.              Anus:  normal sphincter tone, no lesions  Chaperone was present for exam:  ***  Assessment:   Second degree rectocele.  Stable. Left ovarian fibroma.  Status post consultation with GYN ONC.  Osteopenia.  FH cancers.   Plan: Mammogram screening discussed. Self breast awareness reviewed. Pap and HR HPV as above. Guidelines for Calcium, Vitamin D, regular exercise program including cardiovascular and weight bearing exercise.   Follow up annually and prn.   Additional counseling given.  {yes [id number]. _______ minutes face to face time of which over 50% was spent in counseling.    After visit summary provided.

## 2022-01-03 LAB — CYTOLOGY - PAP
Comment: NEGATIVE
Diagnosis: NEGATIVE
High risk HPV: NEGATIVE

## 2022-01-07 ENCOUNTER — Encounter: Payer: Self-pay | Admitting: Obstetrics and Gynecology

## 2022-01-07 ENCOUNTER — Ambulatory Visit (INDEPENDENT_AMBULATORY_CARE_PROVIDER_SITE_OTHER): Payer: Medicare Other | Admitting: Obstetrics and Gynecology

## 2022-01-07 ENCOUNTER — Other Ambulatory Visit (HOSPITAL_COMMUNITY)
Admission: RE | Admit: 2022-01-07 | Discharge: 2022-01-07 | Disposition: A | Discharge status: Home / Self Care | Payer: Medicare Other | Source: Ambulatory Visit | Attending: Obstetrics and Gynecology | Admitting: Obstetrics and Gynecology

## 2022-01-07 VITALS — BP 120/80 | HR 78 | Ht 62.0 in | Wt 161.0 lb

## 2022-01-07 DIAGNOSIS — N762 Acute vulvitis: Secondary | ICD-10-CM | POA: Diagnosis not present

## 2022-01-07 DIAGNOSIS — N9089 Other specified noninflammatory disorders of vulva and perineum: Secondary | ICD-10-CM | POA: Diagnosis not present

## 2022-01-07 NOTE — Patient Instructions (Signed)
Vulva Biopsy, Care After The following information offers guidance on how to care for yourself after your procedure. Your health care provider may also give you more specific instructions. If you have problems or questions, contact your health care provider. What can I expect after the procedure? After the procedure, it is common to have: Slight bleeding from the biopsy site. Slight pain or discomfort at the biopsy site. Follow these instructions at home: Biopsy site care  Follow instructions from your health care provider about how to take care of your biopsy site. Make sure you: Clean the area using water and mild soap twice a day or as told by your health care provider. Gently pat the area dry. You may shower 24 hours after the procedure. If you were prescribed an antibiotic ointment, apply it as told by your health care provider. Do not stop using the antibiotic even if your condition improves. If told by your health care provider, take a sitz bath to help with pain and discomfort. This is a warm water bath that you take while sitting down. Do this as often as told by your health care provider. The water should only come up to your hips and cover your buttocks. You may pat the area dry with a soft, clean towel. Leave stitches (sutures), skin glue, or adhesive strips in place. These skin closures may need to stay in place for 2 weeks or longer. If adhesive strip edges start to loosen and curl up, you may trim the loose edges. Do not remove adhesive strips completely unless your health care provider tells you to do that. Check your biopsy site every day for signs of infection. It may be helpful to use a handheld mirror to do this. Check for: Redness, swelling, or more pain. More fluid or blood. Warmth. Pus or a bad smell. Do not rub the biopsy area after urinating. Gently pat the area dry or use a bottle filled with warm water (peri bottle) to clean the area. Gently wipe from front to  back. Lifestyle Wear loose, cotton underwear. Do not wear tight pants. For at least 1 week or until your health care provider approves: Do not use tampons, douche, or put anything inside your vagina. Do not have sex. Until your health care provider approves: Do not exercise, such as running or biking. Do not swim or use a hot tub. General instructions Take over-the-counter and prescription medicines only as told by your health care provider. Drink enough fluid to keep your urine pale yellow. Use a sanitary napkin until the bleeding stops. If told, put ice on the biopsy site. To do this: Place ice in a plastic bag. Place a towel between your skin and the bag. Leave the ice on for 20 minutes, 2-3 times a day. Remove the ice if your skin turns bright red. This is very important. If you cannot feel pain, heat, or cold, you have a greater risk of damage to the area. Keep all follow-up visits. This is important. Contact a health care provider if: You have redness, swelling, or more pain around your biopsy site. You have more fluid or blood coming from your biopsy site. Your biopsy site feels warm to the touch. Your pain is not controlled with medicine or ice packs. You have a fever or chills. Get help right away if: You have heavy bleeding from the vulva. You have pus or a bad smell coming from the biopsy site. You have abdominal pain. Summary After the procedure, it  is common to have slight bleeding and discomfort at the biopsy site. Follow instructions from your health care provider after your biopsy. Take sitz baths as told by your health care provider to help with pain and discomfort. Leave any sutures in place. Contact your health care provider if you notice any signs of infection around the biopsy site, including redness, swelling, more pain, more fluid or blood, or warmth. Keep all follow-up visits. This is important. This information is not intended to replace advice given to you  by your health care provider. Make sure you discuss any questions you have with your health care provider. Document Revised: 10/30/2020 Document Reviewed: 10/30/2020 Elsevier Patient Education  [place].

## 2022-01-07 NOTE — Progress Notes (Signed)
GYNECOLOGY  VISIT   HPI: 76 y.o.   Married  ,  female   [phone] with No LMP recorded. Patient is postmenopausal.   here for a vulvar biopsy.   She has stopped using her protective incontinence pads.   Asking about her pap result.  GYNECOLOGIC HISTORY: No LMP recorded. Patient is postmenopausal. 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 Take with a full glass of water on an empty stomach. 4 tablet 2   amLODipine (NORVASC) 2.5 MG tablet Take 2.5 mg by mouth daily.     Calcium Carbonate-Vit D-Min (CALTRATE PLUS PO) Take 1 tablet by mouth 2 (two) times daily.      fluticasone (FLONASE) 50 MCG/ACT nasal spray Place 1 spray into both nostrils daily as needed for allergies or rhinitis.     hydrocortisone (ANUSOL-HC) 2.5 % rectal cream APPLY RECTALLY TO THE AFFECTED AREA TWICE DAILY FOR 14 DAYS 30 g 1   multivitamin (THERAGRAN) per tablet Take 1 tablet by mouth daily.       Olopatadine HCl 0.2  % SOLN 1 drop     Psyllium (METAMUCIL PO) Take by mouth every other day.     rosuvastatin (CRESTOR) 40 MG tablet Take 1 tablet (40 mg total) by mouth daily. 90 tablet 3   SYNTHROID 112 MCG tablet Take 112 mcg by mouth every morning.     No current facility-administered medications for this visit.     ALLERGIES: Sulfa antibiotics, Tramadol hcl, Kapidex [dexlansoprazole], and Sulfonamide derivatives  Family History  Problem Relation Age of Onset   Diabetes Mother    Hypertension Mother    Uterine cancer Mother    Heart disease Mother    Cancer Father        prostate   Colon cancer Paternal Aunt    Heart attack Brother    Cancer Brother        Prostate   Cancer Daughter  Thyroid cancer   Cancer Brother        Prostate   Cancer Brother        Prostate    Social History   Socioeconomic History   Marital status: Married    Spouse name: Not on file   Number of children: 3   Years of education: 72   Highest education level: Not on file  Occupational History   Occupation: retired   Tobacco Use   Smoking status: Never   Smokeless tobacco: Never  Vaping Use   Vaping Use: Never used  Substance and Sexual Activity   Alcohol use: Not Currently   Drug use: No   Sexual activity: Not Currently    Birth control/protection: Surgical    Comment: Tubal lig-1st intercourse 20 yo-1 partner  Other Topics Concern   Not on file  Social History Narrative   Right handed   Two story home   Drinks caffeine   Social Determinants of Health   Financial Resource Strain: Not on file  Food Insecurity: Not on file  Transportation Needs: Not on file  Physical Activity: Not on file  Stress: Not on file  Social Connections: Not on file  Intimate Partner Violence: Not on file    Review of Systems  All other systems reviewed and are negative.   PHYSICAL EXAMINATION:    BP 120/80 (BP Location: Right Arm, Patient Position: Sitting, Cuff Size: Normal)   Pulse 78   Ht '5\' 2"'$   (1.575 m)   Wt 161 lb (73 kg)   BMI 29.45 kg/m     General appearance: alert, cooperative and appears stated age  Pelvic:  External genitalia:  vestibule with ring of erythema.  Less erythema than prior visit.              Urethra:  normal appearing urethra with no masses, tenderness or lesions             Vulvar biopsy Consent done.  Sterile prep with Hibiclens.  Local 1% lidocaine, lot [id number], exp 02/25/23.  4 mm punch biopsy.  Sent to pathology. Silver nitrate used.  Single 3/0 Vicryl suture.    Minimal EBL.  No complications.            Chaperone was present for exam:  Kimalexis.   ASSESSMENT  Vulvar lesion.  Possible vulvitis from pad use. Osteoporosis.   PLAN  Pap results reviewed.  FU biopsy.  Precautions given.  Start Fosamax.  FU in 3 months.   An After Visit Summary was printed and given to the patient.

## 2022-01-12 LAB — SURGICAL PATHOLOGY

## 2022-02-04 DIAGNOSIS — E039 Hypothyroidism, unspecified: Secondary | ICD-10-CM | POA: Diagnosis not present

## 2022-02-04 DIAGNOSIS — I10 Essential (primary) hypertension: Secondary | ICD-10-CM | POA: Diagnosis not present

## 2022-02-04 DIAGNOSIS — E782 Mixed hyperlipidemia: Secondary | ICD-10-CM | POA: Diagnosis not present

## 2022-02-05 DIAGNOSIS — E782 Mixed hyperlipidemia: Secondary | ICD-10-CM | POA: Diagnosis not present

## 2022-02-05 DIAGNOSIS — Z6829 Body mass index (BMI) 29.0-29.9, adult: Secondary | ICD-10-CM | POA: Diagnosis not present

## 2022-02-05 DIAGNOSIS — Z0000 Encounter for general adult medical examination without abnormal findings: Secondary | ICD-10-CM | POA: Diagnosis not present

## 2022-02-05 DIAGNOSIS — I10 Essential (primary) hypertension: Secondary | ICD-10-CM | POA: Diagnosis not present

## 2022-03-20 NOTE — Progress Notes (Deleted)
GYNECOLOGY  VISIT   HPI: 77 y.o.   Married  Caucasian  female   [phone] with No LMP recorded. Patient is postmenopausal.   here for   3 month recheck after vulvar bx  GYNECOLOGIC HISTORY: No LMP recorded. Patient is postmenopausal. Contraception:  post menopausal/ BTL Menopausal hormone therapy:  n/a Last mammogram:  05/22/21 Breast Density B, BI-RADS CATEGORY 1 Neg Last pap smear:   01/01/22 neg: HR HPV neg, 12-13-19 Neg, 10-02-16 Neg, 08-25-13 Neg. No history of abnormal paps.               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 Take with a full glass of water on an empty stomach. 4 tablet 2   amLODipine (NORVASC) 2.5 MG tablet Take 2.5 mg by mouth daily.     Calcium Carbonate-Vit D-Min (CALTRATE PLUS PO) Take 1 tablet by mouth 2 (two) times daily.      fluticasone (FLONASE) 50 MCG/ACT nasal spray Place 1 spray into both nostrils daily as needed for allergies or rhinitis.     hydrocortisone (ANUSOL-HC) 2.5 % rectal cream APPLY RECTALLY TO THE AFFECTED AREA TWICE DAILY FOR 14 DAYS 30 g 1   multivitamin (THERAGRAN) per tablet Take 1 tablet by mouth  daily.       Olopatadine HCl 0.2 % SOLN 1 drop     Psyllium (METAMUCIL PO) Take by mouth every other day.     rosuvastatin (CRESTOR) 40 MG tablet Take 1 tablet (40 mg total) by mouth daily. 90 tablet 3   SYNTHROID 112 MCG tablet Take 112 mcg by mouth every morning.     No current facility-administered medications for this visit.     ALLERGIES: Sulfa antibiotics, Tramadol hcl, Kapidex [dexlansoprazole], and Sulfonamide derivatives  Family History  Problem Relation Age of Onset   Diabetes Mother    Hypertension Mother    Uterine cancer Mother    Heart disease Mother    Cancer Father        prostate   Colon cancer Paternal Aunt    Heart attack Brother    Cancer Brother  Prostate   Cancer Daughter        Thyroid cancer   Cancer Brother        Prostate   Cancer Brother        Prostate    Social History   Socioeconomic History   Marital status: Married    Spouse name: Not on file   Number of children: 3   Years of education: 75   Highest education level: Not on file  Occupational History   Occupation: retired   Tobacco Use   Smoking status: Never   Smokeless tobacco: Never  Vaping Use   Vaping Use: Never used  Substance and Sexual Activity   Alcohol use: Not Currently   Drug use: No   Sexual activity: Not Currently    Birth control/protection: Surgical    Comment: Tubal lig-1st intercourse 38 yo-1 partner  Other Topics Concern   Not on file  Social History Narrative   Right handed   Two story home   Drinks caffeine   Social Determinants of Health   Financial Resource Strain: Not on file  Food Insecurity: Not on file  Transportation Needs: Not on file  Physical Activity: Not on file  Stress: Not on file  Social Connections: Not on file  Intimate Partner Violence: Not on file    Review of Systems  PHYSICAL EXAMINATION:    There were no vitals taken for this visit.    General appearance: alert, cooperative and appears stated age Head:  Normocephalic, without obvious abnormality, atraumatic Neck: no adenopathy, supple, symmetrical, trachea midline and thyroid normal to inspection and palpation Lungs: clear to auscultation bilaterally Breasts: normal appearance, no masses or tenderness, No nipple retraction or dimpling, No nipple discharge or bleeding, No axillary or supraclavicular adenopathy Heart: regular rate and rhythm Abdomen: soft, non-tender, no masses,  no organomegaly Extremities: extremities normal, atraumatic, no cyanosis or edema Skin: Skin color, texture, turgor normal. No rashes or lesions Lymph nodes: Cervical, supraclavicular, and axillary nodes normal. No abnormal inguinal nodes palpated Neurologic: Grossly normal  Pelvic: External genitalia:  no lesions              Urethra:  normal appearing urethra with no masses, tenderness or lesions              Bartholins and Skenes: normal                 Vagina: normal appearing vagina with normal color and discharge, no lesions              Cervix: no lesions                Bimanual Exam:  Uterus:  normal size, contour, position, consistency, mobility, non-tender              Adnexa: no mass, fullness, tenderness              Rectal exam: {yes no:314532}.  Confirms.              Anus:  normal sphincter tone, no lesions  Chaperone was present for exam:  ***  ASSESSMENT     PLAN     An After Visit Summary was printed and given to the patient.  ______ minutes face to face time of which over 50% was spent in counseling.

## 2022-03-31 DIAGNOSIS — H40013 Open angle with borderline findings, low risk, bilateral: Secondary | ICD-10-CM | POA: Diagnosis not present

## 2022-03-31 DIAGNOSIS — H2513 Age-related nuclear cataract, bilateral: Secondary | ICD-10-CM | POA: Diagnosis not present

## 2022-04-02 ENCOUNTER — Ambulatory Visit: Payer: Medicare Other | Admitting: Obstetrics and Gynecology

## 2022-04-08 ENCOUNTER — Other Ambulatory Visit: Payer: Self-pay | Admitting: Obstetrics and Gynecology

## 2022-04-08 NOTE — Progress Notes (Deleted)
GYNECOLOGY  VISIT   HPI: 77 y.o.   Married  [place] American  female   [phone] with No LMP recorded. Patient is postmenopausal.   here for   3 month vulvar lesion f/u  GYNECOLOGIC HISTORY: No LMP recorded. Patient is postmenopausal. 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 Take with a full glass of water on an empty stomach. 4 tablet 2   amLODipine (NORVASC) 2.5 MG tablet Take 2.5 mg by mouth daily.     Calcium Carbonate-Vit D-Min (CALTRATE PLUS PO) Take 1 tablet by mouth 2 (two) times daily.      fluticasone (FLONASE) 50 MCG/ACT nasal spray Place 1 spray into both nostrils daily as needed for allergies or rhinitis.     hydrocortisone (ANUSOL-HC) 2.5 % rectal cream APPLY RECTALLY TO THE AFFECTED AREA TWICE DAILY FOR 14 DAYS 30 g 1   multivitamin (THERAGRAN) per tablet Take 1 tablet by mouth daily.       Olopatadine HCl 0.2 % SOLN 1 drop     Psyllium (METAMUCIL PO) Take by mouth every other day.      rosuvastatin (CRESTOR) 40 MG tablet Take 1 tablet (40 mg total) by mouth daily. 90 tablet 3   SYNTHROID 112 MCG tablet Take 112 mcg by mouth every morning.     No current facility-administered medications for this visit.     ALLERGIES: Sulfa antibiotics, Tramadol hcl, Kapidex [dexlansoprazole], and Sulfonamide derivatives  Family History  Problem Relation Age of Onset   Diabetes Mother    Hypertension Mother    Uterine cancer Mother    Heart disease Mother    Cancer Father        prostate   Colon cancer Paternal Aunt    Heart attack Brother    Cancer Brother        Prostate   Cancer Daughter        Thyroid cancer   Cancer Brother  Prostate   Cancer Brother        Prostate    Social History   Socioeconomic History   Marital status: Married    Spouse name: Not on file   Number of children: 3   Years of education: 14   Highest education level: Not on file  Occupational History   Occupation: retired   Tobacco Use   Smoking status: Never   Smokeless tobacco: Never  Vaping Use   Vaping Use: Never used  Substance and Sexual Activity   Alcohol use: Not Currently   Drug use: No   Sexual activity: Not Currently    Birth control/protection: Surgical    Comment: Tubal lig-1st intercourse 20 yo-1 partner  Other Topics Concern   Not on file  Social History Narrative   Right handed   Two story home   Drinks caffeine   Social Determinants of Health   Financial Resource Strain: Not on file  Food Insecurity: Not on file  Transportation Needs: Not on file  Physical Activity: Not on file  Stress: Not on file  Social Connections: Not on file  Intimate Partner Violence: Not on file    Review of Systems  PHYSICAL EXAMINATION:    There were no vitals taken for this visit.    General appearance: alert, cooperative and appears stated age Head: Normocephalic, without obvious abnormality, atraumatic Neck: no adenopathy, supple, symmetrical, trachea midline and  thyroid normal to inspection and palpation Lungs: clear to auscultation bilaterally Breasts: normal appearance, no masses or tenderness, No nipple retraction or dimpling, No nipple discharge or bleeding, No axillary or supraclavicular adenopathy Heart: regular rate and rhythm Abdomen: soft, non-tender, no masses,  no organomegaly Extremities: extremities normal, atraumatic, no cyanosis or edema Skin: Skin color, texture, turgor normal. No rashes or lesions Lymph nodes: Cervical, supraclavicular, and axillary nodes normal. No abnormal inguinal nodes palpated Neurologic: Grossly normal  Pelvic: External genitalia:  no lesions              Urethra:  normal appearing urethra with no masses, tenderness or lesions              Bartholins and Skenes: normal                 Vagina: normal appearing vagina with normal color and discharge, no lesions              Cervix: no lesions                Bimanual Exam:  Uterus:  normal size, contour, position, consistency, mobility, non-tender              Adnexa: no mass, fullness, tenderness              Rectal exam: {yes no:314532}.  Confirms.              Anus:  normal sphincter tone, no lesions  Chaperone was present for exam:  ***  ASSESSMENT     PLAN     An After Visit Summary was printed and given to the patient.  ______ minutes face to face time of which over 50% was spent in counseling.

## 2022-04-08 NOTE — Telephone Encounter (Signed)
Visit 01/01/22. "If vit D level is normal, she will start Fosamax 70 mg weekly. Instructed in how to take medication.  She will need follow up in 3 months for a med recheck. "  Follow up Appt scheduled 04/22/22 at 9:15am

## 2022-04-09 NOTE — Progress Notes (Deleted)
NEUROLOGY FOLLOW UP OFFICE NOTE  Lynn Collins [id number]  Assessment/Plan:   Cervical radiculopathy  Suspect bilateral carpal tunnel syndrome as well  ***  Subjective:  Lynn Collins is a 77 year old female with HTN, hypothyroidism and history of kidney stone who follows up for cervical radiculopathy.   UPDATE: For suspected carpal tunnel, she was advised to wear bilateral wrist splints.  ***  HISTORY: She has a history of neck injury as a child with subsequent flare ups of neck pain over the years.  She was treated for neck pain in 1997 and 2007.  MRI of cervical spine from 05/26/2005 showed primarily left sided facet hypertrophy causing mild foraminal narrowing at C4-5, C5-6 and C6-7.  Cervical spine X-ray from 11/17/2014 showed diffuse degenerative changes with prominent disc space loss and endplate osteophyte formation at C5-6, C6-7 and C7-T1.  In January, she was walking her dog when the dog suddenly pulled her hard forward with the leash.  Afterwards, she developed bilateral shoulder and upper arm pain (right worse than left) as well as numbness and tingling in the fingers.  For about a day, she also noticed some numbness on the left side of her mouth.  No weakness.  She initially treated with  meloxicam but it made her nauseous.  She started using Motrin and cervical collar.  She has improved.  Neck and shoulder pain resolved.  She still notes some numbness and tingling in the fingers, particularly the last two fingers of each hand.  She mostly notices it when she wakes up in the morning in bed and needs to shake it out.     PAST MEDICAL HISTORY: Past Medical History:  Diagnosis Date   Arthritis    Atrophic vaginitis    Diverticulitis    Exposure to TB    possible per pt   Hemorrhoids    Hypertension    Hypothyroidism    Nodule   Kidney stone    Osteopenia 12/2018   T score -1.8 FRAX 9.1% / 4.2%   Rectocele    Varicose veins     MEDICATIONS: Current  Outpatient Medications on File Prior to Visit  Medication Sig Dispense Refill   alendronate (FOSAMAX) 70 MG tablet TAKE 1 TABLET(70 MG) BY MOUTH EVERY 7 DAYS WITH A FULL GLASS OF WATER AND ON AN EMPTY STOMACH 4 tablet 0   amLODipine (NORVASC) 2.5 MG tablet Take 2.5 mg by mouth daily.     Calcium Carbonate-Vit D-Min (CALTRATE PLUS PO) Take 1 tablet by mouth 2 (two) times daily.      fluticasone (FLONASE) 50 MCG/ACT nasal spray Place 1 spray into both nostrils daily as needed for allergies or rhinitis.     hydrocortisone (ANUSOL-HC) 2.5 % rectal cream APPLY RECTALLY TO THE AFFECTED AREA TWICE DAILY FOR 14 DAYS 30 g 1   multivitamin (THERAGRAN) per tablet Take 1 tablet by mouth daily.       Olopatadine HCl 0.2 % SOLN 1 drop     Psyllium (METAMUCIL PO) Take by mouth every other day.     rosuvastatin (CRESTOR) 40 MG tablet Take 1 tablet (40 mg total) by mouth daily. 90 tablet 3   SYNTHROID 112 MCG tablet Take 112 mcg by mouth every morning.     No current facility-administered medications on file prior to visit.    ALLERGIES: Allergies  Allergen Reactions   Sulfa Antibiotics Other (See Comments)   Tramadol Hcl Other (See Comments)   Kapidex [Dexlansoprazole] Rash  Sulfonamide Derivatives Rash    FAMILY HISTORY: Family History  Problem Relation Age of Onset   Diabetes Mother    Hypertension Mother    Uterine cancer Mother    Heart disease Mother    Cancer Father        prostate   Colon cancer Paternal Aunt    Heart attack Brother    Cancer Brother        Prostate   Cancer Daughter        Thyroid cancer   Cancer Brother        Prostate   Cancer Brother        Prostate      Objective:  *** General: No acute distress.  Patient appears well-groomed.   Head:  Normocephalic/atraumatic Eyes:  Fundi examined but not visualized Neck: supple, no paraspinal tenderness, full range of motion Neurological Exam: ***   Metta Clines, DO  CC: C. Melinda Crutch, MD

## 2022-04-10 ENCOUNTER — Ambulatory Visit: Payer: Medicare Other | Admitting: Neurology

## 2022-04-22 ENCOUNTER — Ambulatory Visit: Payer: Medicare Other | Admitting: Obstetrics and Gynecology

## 2022-04-23 DIAGNOSIS — E042 Nontoxic multinodular goiter: Secondary | ICD-10-CM | POA: Diagnosis not present

## 2022-04-23 DIAGNOSIS — E039 Hypothyroidism, unspecified: Secondary | ICD-10-CM | POA: Diagnosis not present

## 2022-04-28 ENCOUNTER — Ambulatory Visit: Payer: Medicare Other | Admitting: Obstetrics and Gynecology

## 2022-04-30 NOTE — Progress Notes (Signed)
GYNECOLOGY  VISIT   HPI: 77 y.o.   Married  [place] American  female   [phone] with No LMP recorded. Patient is postmenopausal.   here for   3 month vulvar check  and recheck on Fosamax use.   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  No dysplasia or viral change noted.  Fungal stain negative.   The irritation was attributed to use of a protective pad.  She changed to cotton pad and is doing better.   She stopped taking Fosamax until she is completing dental work.  She has been tolerating the Fosamax.    Walks the dog regularly.  GYNECOLOGIC HISTORY: No LMP recorded. Patient is postmenopausal. Contraception:  PMP Menopausal hormone therapy:  n/a Last mammogram:  05/13/22- waiting on result, 05/22/21 Breast Density Category B, BI-RADS CATEGORY 1 Neg Last pap smear:   01/01/22 neg: HR HPV neg        OB History     Gravida  4   Para  3   Term  3   Preterm      AB  1   Living  3      SAB  1   IAB      Ectopic      Multiple      Live Births                 Patient Active Problem List   Diagnosis Date Noted   Arthritis of left knee 05/14/2022   Internal hemorrhoid 04/25/2016   Chronic maxillary sinusitis 04/25/2016   Hematuria 05/04/2015   Kidney stone 06/23/2014   Multinodular goiter 09/02/2013   Diverticulosis 05/09/2013   Mixed hyperlipidemia 12/10/2011   Hypothyroidism (acquired) 12/10/2011   Essential (primary) hypertension 12/10/2011   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     ascorbic acid (C 500/ROSE HIPS) 500 MG tablet Take by mouth.     fluticasone (FLONASE) 50 MCG/ACT nasal spray Place 1 spray into both nostrils daily as needed for allergies or rhinitis.     hydrocortisone (ANUSOL-HC) 2.5 % rectal cream APPLY RECTALLY TO THE AFFECTED AREA TWICE DAILY FOR 14 DAYS 30 g 1   multivitamin (THERAGRAN) per tablet Take 1 tablet by mouth daily.       Olopatadine HCl 0.2 % SOLN 1 drop     Psyllium (METAMUCIL PO) Take by mouth every other day.     rosuvastatin (CRESTOR) 40 MG tablet Take 1 tablet (40 mg total) by mouth daily. 90 tablet 3   SYNTHROID 112 MCG tablet Take 112 mcg by mouth every morning.     alendronate (FOSAMAX) 70 MG tablet TAKE 1 TABLET(70 MG) BY MOUTH EVERY  7 DAYS WITH A FULL GLASS OF WATER AND ON AN EMPTY STOMACH 12 tablet 3   Calcium Carbonate-Vit D-Min (CALTRATE PLUS PO) Take 1 tablet by mouth 2 (two) times daily.  (Patient not taking: Reported on 05/14/2022)     No current facility-administered medications for this visit.     ALLERGIES: Sulfa antibiotics, Tramadol hcl, Kapidex [dexlansoprazole], and Sulfonamide derivatives  Family History  Problem Relation Age of Onset   Diabetes Mother    Hypertension Mother    Uterine cancer Mother    Heart disease Mother    Cancer Father        prostate   Colon cancer Paternal Aunt    Heart attack Brother    Cancer Brother        Prostate   Cancer Daughter        Thyroid cancer   Cancer Brother        Prostate   Cancer Brother        Prostate    Social History   Socioeconomic History   Marital status: Married    Spouse name: Not on file   Number of children: 3   Years of education: 18   Highest education level: Not on file  Occupational History   Occupation: retired   Tobacco Use   Smoking status: Never   Smokeless tobacco: Never  Brewing technologist Use: Never used  Substance and Sexual Activity   Alcohol use: Not Currently   Drug use: No   Sexual activity: Not Currently    Birth control/protection: Surgical    Comment: Tubal lig-1st intercourse 21 yo-1 partner  Other Topics Concern   Not on file  Social History Narrative   Right handed   Two story home   Drinks caffeine   Social Determinants of Health   Financial Resource Strain: Not on file  Food Insecurity: Not on file  Transportation Needs: Not on file  Physical Activity: Not on file  Stress: Not on file  Social Connections: Not on file  Intimate Partner Violence: Not on file    Review of Systems  All other systems reviewed and are negative.   PHYSICAL EXAMINATION:    BP 130/80 (BP Location: Left Arm, Patient Position: Sitting, Cuff Size: Normal)   Pulse 75   Ht 5' 2.5" (1.588 m)   Wt 158 lb (71.7 kg)   SpO2 97%   BMI 28.44 kg/m     General appearance: alert, cooperative and appears stated age  Pelvic: External genitalia:  minor erythema of left vesitbule.  No lesions.               Urethra:  normal appearing urethra with no masses, tenderness or lesions      Chaperone was present for exam:  Emily  ASSESSMENT  Vulvitis.  Resolved with change in pad. Osteoporosis without current fracture.  Encounter for medication monitoring.  Fosamax.   PLAN  Continue Fosamax use 70 mg weekly.  #12, RF 3.  She will discuss with her dentist when she may resume use of the Fosamax. I encouraged regular intake of calcium and vitamin D and I encouraged regular exercise.  Follow up visit and bone density in December, 2024.

## 2022-05-07 ENCOUNTER — Ambulatory Visit: Payer: Medicare Other | Admitting: Obstetrics and Gynecology

## 2022-05-13 DIAGNOSIS — Z1231 Encounter for screening mammogram for malignant neoplasm of breast: Secondary | ICD-10-CM | POA: Diagnosis not present

## 2022-05-14 ENCOUNTER — Encounter: Payer: Self-pay | Admitting: Obstetrics and Gynecology

## 2022-05-14 ENCOUNTER — Ambulatory Visit (INDEPENDENT_AMBULATORY_CARE_PROVIDER_SITE_OTHER): Payer: Medicare Other | Admitting: Obstetrics and Gynecology

## 2022-05-14 VITALS — BP 130/80 | HR 75 | Ht 62.5 in | Wt 158.0 lb

## 2022-05-14 DIAGNOSIS — N762 Acute vulvitis: Secondary | ICD-10-CM

## 2022-05-14 DIAGNOSIS — M810 Age-related osteoporosis without current pathological fracture: Secondary | ICD-10-CM

## 2022-05-14 DIAGNOSIS — Z5181 Encounter for therapeutic drug level monitoring: Secondary | ICD-10-CM

## 2022-05-14 DIAGNOSIS — M1712 Unilateral primary osteoarthritis, left knee: Secondary | ICD-10-CM | POA: Insufficient documentation

## 2022-05-14 MED ORDER — ALENDRONATE SODIUM 70 MG PO TABS: ORAL_TABLET | ORAL | 3 refills | Status: DC

## 2022-07-08 NOTE — Progress Notes (Deleted)
NEUROLOGY FOLLOW UP OFFICE NOTE  Lynn Collins [id number]  Assessment/Plan:   Cervical radiculopathy aggravated by acute trauma/whiplash injury, resolving Suspect bilateral carpal tunnel syndrome as well  We will continue to monitor for further resolution of symptoms.  Advised to wear wrist splints at night when she sleeps and as much during the day as possible.  If symptoms worsen, we can consider further testing such as NCV-EMG or cervical spine imaging.  Follow up.    Subjective:  Lynn Collins is a 77 year old female with HTN, hypothyroidism and history of kidney stone who follows up for cervical radiculopathy.   UPDATE: Advised to wear wrist splints.  ***.  HISTORY: She has a history of neck injury as a child with subsequent flare ups of neck pain over the years.  She was treated for neck pain in 1997 and 2007.  MRI of cervical spine from 05/26/2005 showed primarily left sided facet hypertrophy causing mild foraminal narrowing at C4-5, C5-6 and C6-7.  Cervical spine X-ray from 11/17/2014 showed diffuse degenerative changes with prominent disc space loss and endplate osteophyte formation at C5-6, C6-7 and C7-T1.  In January, she was walking her dog when the dog suddenly pulled her hard forward with the leash.  Afterwards, she developed bilateral shoulder and upper arm pain (right worse than left) as well as numbness and tingling in the fingers.  For about a day, she also noticed some numbness on the left side of her mouth.  No weakness.  She initially treated with  meloxicam but it made her nauseous.  She started using Motrin and cervical collar.  She has improved.  Neck and shoulder pain resolved.  She still notes some numbness and tingling in the fingers, particularly the last two fingers of each hand.  She mostly notices it when she wakes up in the morning in bed and needs to shake it out.     PAST MEDICAL HISTORY: Past Medical History:  Diagnosis Date   Arthritis     Atrophic vaginitis    Diverticulitis    Exposure to TB    possible per pt   Hemorrhoids    Hypertension    Hypothyroidism    Nodule   Kidney stone    Osteopenia 12/2018   T score -1.8 FRAX 9.1% / 4.2%   Rectocele    Varicose veins     MEDICATIONS: Current Outpatient Medications on File Prior to Visit  Medication Sig Dispense Refill   alendronate (FOSAMAX) 70 MG tablet TAKE 1 TABLET(70 MG) BY MOUTH EVERY 7 DAYS WITH A FULL GLASS OF WATER AND ON AN EMPTY STOMACH 12 tablet 3   amLODipine (NORVASC) 2.5 MG tablet Take 2.5 mg by mouth daily.     ascorbic acid (C 500/ROSE HIPS) 500 MG tablet Take by mouth.     Calcium Carbonate-Vit D-Min (CALTRATE PLUS PO) Take 1 tablet by mouth 2 (two) times daily.  (Patient not taking: Reported on 05/14/2022)     fluticasone (FLONASE) 50 MCG/ACT nasal spray Place 1 spray into both nostrils daily as needed for allergies or rhinitis.     hydrocortisone (ANUSOL-HC) 2.5 % rectal cream APPLY RECTALLY TO THE AFFECTED AREA TWICE DAILY FOR 14 DAYS 30 g 1   multivitamin (THERAGRAN) per tablet Take 1 tablet by mouth daily.       Olopatadine HCl 0.2 % SOLN 1 drop     Psyllium (METAMUCIL PO) Take by mouth every other day.     rosuvastatin (CRESTOR) 40  MG tablet Take 1 tablet (40 mg total) by mouth daily. 90 tablet 3   SYNTHROID 112 MCG tablet Take 112 mcg by mouth every morning.     No current facility-administered medications on file prior to visit.    ALLERGIES: Allergies  Allergen Reactions   Sulfa Antibiotics Other (See Comments)   Tramadol Hcl Other (See Comments)   Kapidex [Dexlansoprazole] Rash   Sulfonamide Derivatives Rash    FAMILY HISTORY: Family History  Problem Relation Age of Onset   Diabetes Mother    Hypertension Mother    Uterine cancer Mother    Heart disease Mother    Cancer Father        prostate   Colon cancer Paternal Aunt    Heart attack Brother    Cancer Brother        Prostate   Cancer Daughter        Thyroid cancer    Cancer Brother        Prostate   Cancer Brother        Prostate      Objective:  *** General: No acute distress.  Patient appears well-groomed.   Head:  Normocephalic/atraumatic Eyes:  Fundi examined but not visualized Neck: supple, no paraspinal tenderness, full range of motion Neurological Exam: alert and oriented to person, place, and time.  Speech fluent and not dysarthric, language intact.  CN II-XII intact. Bulk and tone normal, muscle strength 5/5 throughout.  Sensation to light touch intact.  Deep tendon reflexes 2+ throughout.  Finger to nose testing intact.  Gait normal, Romberg negative.   [name], DO  CC: C. Duane Lope, MD

## 2022-07-14 ENCOUNTER — Encounter: Payer: Self-pay | Admitting: Neurology

## 2022-07-14 ENCOUNTER — Ambulatory Visit: Payer: Medicare Other | Admitting: Neurology

## 2022-07-14 DIAGNOSIS — Z029 Encounter for administrative examinations, unspecified: Secondary | ICD-10-CM

## 2022-09-01 DIAGNOSIS — M4802 Spinal stenosis, cervical region: Secondary | ICD-10-CM | POA: Diagnosis not present

## 2022-09-01 DIAGNOSIS — M5412 Radiculopathy, cervical region: Secondary | ICD-10-CM | POA: Diagnosis not present

## 2022-09-02 ENCOUNTER — Other Ambulatory Visit: Payer: Self-pay | Admitting: Physical Medicine & Rehabilitation

## 2022-09-02 DIAGNOSIS — M5412 Radiculopathy, cervical region: Secondary | ICD-10-CM

## 2022-09-13 ENCOUNTER — Ambulatory Visit
Admission: RE | Admit: 2022-09-13 | Discharge: 2022-09-13 | Disposition: A | Discharge status: Home / Self Care | Payer: Medicare Other | Source: Ambulatory Visit | Attending: Physical Medicine & Rehabilitation | Admitting: Physical Medicine & Rehabilitation

## 2022-09-13 DIAGNOSIS — M5412 Radiculopathy, cervical region: Secondary | ICD-10-CM

## 2022-09-13 DIAGNOSIS — M50323 Other cervical disc degeneration at C6-C7 level: Secondary | ICD-10-CM | POA: Diagnosis not present

## 2022-10-20 DIAGNOSIS — M4802 Spinal stenosis, cervical region: Secondary | ICD-10-CM | POA: Diagnosis not present

## 2022-10-20 DIAGNOSIS — M5412 Radiculopathy, cervical region: Secondary | ICD-10-CM | POA: Diagnosis not present

## 2022-10-29 DIAGNOSIS — M542 Cervicalgia: Secondary | ICD-10-CM | POA: Diagnosis not present

## 2022-11-03 DIAGNOSIS — M542 Cervicalgia: Secondary | ICD-10-CM | POA: Diagnosis not present

## 2022-11-08 DIAGNOSIS — U071 COVID-19: Secondary | ICD-10-CM | POA: Diagnosis not present

## 2022-11-19 DIAGNOSIS — M5412 Radiculopathy, cervical region: Secondary | ICD-10-CM | POA: Diagnosis not present

## 2022-11-19 HISTORY — PX: OTHER SURGICAL HISTORY: SHX169

## 2022-11-25 DIAGNOSIS — Z6829 Body mass index (BMI) 29.0-29.9, adult: Secondary | ICD-10-CM | POA: Diagnosis not present

## 2022-11-25 DIAGNOSIS — J40 Bronchitis, not specified as acute or chronic: Secondary | ICD-10-CM | POA: Diagnosis not present

## 2022-12-01 NOTE — Progress Notes (Deleted)
NEUROLOGY FOLLOW UP OFFICE NOTE  SANAII Collins [id number]  Assessment/Plan:   Cervical radiculopathy aggravated by acute trauma/whiplash injury, resolving Suspect bilateral carpal tunnel syndrome as well  We will continue to monitor for further resolution of symptoms.  Advised to wear wrist splints at night when she sleeps and as much during the day as possible.  If symptoms worsen, we can consider further testing such as NCV-EMG or cervical spine imaging.  Follow up.    Subjective:  Lynn Collins is a 77 year old female with HTN, hypothyroidism and history of kidney stone who follows up for cervical radiculopathy.  History supplemented by PCP note.  UPDATE: ***  She has been followed by neurosurgery at [facility].  MRI of cervical spine on 09/13/2022 personally reviewed showed diffuse degenerative disc and facet disease greatest at the C6-7 disc space wthere there is mild discogenic edema as well as foraminal impingement on the left greater than right at C6-7 but no spinal canal stenosis.  She underwent epidural injection at C6-7 level ***  HISTORY: She has a history of neck injury as a child with subsequent flare ups of neck pain over the years.  She was treated for neck pain in 1997 and 2007.  MRI of cervical spine from 05/26/2005 showed primarily left sided facet hypertrophy causing mild foraminal narrowing at C4-5, C5-6 and C6-7.  Cervical spine X-ray from 11/17/2014 showed diffuse degenerative changes with prominent disc space loss and endplate osteophyte formation at C5-6, C6-7 and C7-T1.  In January 2023, she was walking her dog when the dog suddenly pulled her hard forward with the leash.  Afterwards, she developed bilateral shoulder and upper arm pain (right worse than left) as well as numbness and tingling in the fingers.  For about a day, she also noticed some numbness on the left side of her mouth.  No weakness.  She initially treated with  meloxicam but it made her nauseous.   She started using Motrin and cervical collar.  She has improved.  Neck and shoulder pain resolved.  She still notes some numbness and tingling in the fingers, particularly the last two fingers of each hand.  She mostly notices it when she wakes up in the morning in bed and needs to shake it out.     PAST MEDICAL HISTORY: Past Medical History:  Diagnosis Date   Arthritis    Atrophic vaginitis    Diverticulitis    Exposure to TB    possible per pt   Hemorrhoids    Hypertension    Hypothyroidism    Nodule   Kidney stone    Osteopenia 12/2018   T score -1.8 FRAX 9.1% / 4.2%   Rectocele    Varicose veins     MEDICATIONS: Current Outpatient Medications on File Prior to Visit  Medication Sig Dispense Refill   alendronate (FOSAMAX) 70 MG tablet TAKE 1 TABLET(70 MG) BY MOUTH EVERY 7 DAYS WITH A FULL GLASS OF WATER AND ON AN EMPTY STOMACH 12 tablet 3   amLODipine (NORVASC) 2.5 MG tablet Take 2.5 mg by mouth daily.     ascorbic acid (C 500/ROSE HIPS) 500 MG tablet Take by mouth.     Calcium Carbonate-Vit D-Min (CALTRATE PLUS PO) Take 1 tablet by mouth 2 (two) times daily.  (Patient not taking: Reported on 05/14/2022)     fluticasone (FLONASE) 50 MCG/ACT nasal spray Place 1 spray into both nostrils daily as needed for allergies or rhinitis.     hydrocortisone (  ANUSOL-HC) 2.5 % rectal cream APPLY RECTALLY TO THE AFFECTED AREA TWICE DAILY FOR 14 DAYS 30 g 1   multivitamin (THERAGRAN) per tablet Take 1 tablet by mouth daily.       Olopatadine HCl 0.2 % SOLN 1 drop     Psyllium (METAMUCIL PO) Take by mouth every other day.     rosuvastatin (CRESTOR) 40 MG tablet Take 1 tablet (40 mg total) by mouth daily. 90 tablet 3   SYNTHROID 112 MCG tablet Take 112 mcg by mouth every morning.     No current facility-administered medications on file prior to visit.    ALLERGIES: Allergies  Allergen Reactions   Sulfa Antibiotics Other (See Comments)   Tramadol Hcl Other (See Comments)   Kapidex  [Dexlansoprazole] Rash   Sulfonamide Derivatives Rash    FAMILY HISTORY: Family History  Problem Relation Age of Onset   Diabetes Mother    Hypertension Mother    Uterine cancer Mother    Heart disease Mother    Cancer Father        prostate   Colon cancer Paternal Aunt    Heart attack Brother    Cancer Brother        Prostate   Cancer Daughter        Thyroid cancer   Cancer Brother        Prostate   Cancer Brother        Prostate      Objective:  Blood pressure 132/81, pulse 73, height 5\' 3"  (1.6 m), weight 164 lb (74.4 kg), SpO2 96 %. General: No acute distress.  Patient appears well-groomed.   Head:  Normocephalic/atraumatic Eyes:  Fundi examined but not visualized Neck: supple, no paraspinal tenderness, full range of motion Neurological Exam: alert and oriented to person, place, and time.  Speech fluent and not dysarthric, language intact.  CN II-XII intact. Bulk and tone normal, muscle strength 5/5 throughout.  Sensation to light touch reduced in thumbs and thenar eminences.  Deep tendon reflexes 2+ throughout, toes downgoing.  Finger to nose testing intact.  Gait normal, Romberg negative.   [name], DO  CC: C. Duane Lope, MD

## 2022-12-02 ENCOUNTER — Ambulatory Visit: Payer: Medicare Other | Admitting: Neurology

## 2022-12-09 DIAGNOSIS — Z1211 Encounter for screening for malignant neoplasm of colon: Secondary | ICD-10-CM | POA: Diagnosis not present

## 2022-12-09 DIAGNOSIS — K5730 Diverticulosis of large intestine without perforation or abscess without bleeding: Secondary | ICD-10-CM | POA: Diagnosis not present

## 2022-12-18 DIAGNOSIS — Z23 Encounter for immunization: Secondary | ICD-10-CM | POA: Diagnosis not present

## 2022-12-18 DIAGNOSIS — J019 Acute sinusitis, unspecified: Secondary | ICD-10-CM | POA: Diagnosis not present

## 2022-12-18 DIAGNOSIS — Z6829 Body mass index (BMI) 29.0-29.9, adult: Secondary | ICD-10-CM | POA: Diagnosis not present

## 2022-12-18 DIAGNOSIS — M5382 Other specified dorsopathies, cervical region: Secondary | ICD-10-CM | POA: Diagnosis not present

## 2023-01-05 NOTE — Progress Notes (Unsigned)
77 y.o. Lynn Collins Married [place] American female here for breast and pelvic exam.  She is also followed for a rectocele, hx left ovarian fibroma, and osteopenia.   Taking Metamucil to control her rectocele.  Has urinary urgency if she waits too long to empty her bladder.    She had a pelvic US 07/20/19 for LLQ pain and was noted to have a normal uterus and normal bilateral ovaries with benign calcifications.  She has seen GYN ONC in the past.   She takes Fosamax weekly.  No problems tolerating medication.   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  She had irritation from pad use.   Had a fusion of her neck at [facility] on 11/19/22.   Husband had myeloma which is in remission.   Married fro 57 years.   PCP: Daisy Floro, MD   No LMP recorded. Patient is postmenopausal.           Sexually active: No.  The current method of family planning is tubal ligation.    Menopausal hormone therapy:  n/a Exercising: Yes.     Walking 3x a day Smoker:  no  OB History  Gravida Para Term Preterm AB Living  4 3 3   1 3   SAB IAB Ectopic Multiple Live Births  1            # Outcome Date GA Lbr Len/2nd Weight Sex Type Anes PTL Lv  4 SAB           3 Term           2 Term           1 Term              HEALTH MAINTENANCE:    Component Value Date/Time   DIAGPAP  01/01/2022 1206    - Negative for intraepithelial lesion or malignancy (NILM)   HPVHIGH Negative 01/01/2022 1206   ADEQPAP  01/01/2022 1206    Satisfactory for evaluation; transformation zone component PRESENT.    History of abnormal Pap or positive HPV:  no Mammogram: 05/13/22 Breast Density Cat B, BI-RADS CAT 1 neg Colonoscopy:  12/09/22 - Dr. Matthias Hughs. Bone Density:  02/12/21  Result  osteopenia   Immunization History  Administered Date(s) Administered   PFIZER(Purple Top)SARS-COV-2 Vaccination 03/08/2019, 03/27/2019, 11/22/2019      reports that she has never smoked. She  has never used smokeless tobacco. She reports that she does not currently use alcohol. She reports that she does not use drugs.  Past Medical History:  Diagnosis Date   Arthritis    Atrophic vaginitis    Diverticulitis    Exposure to TB    possible per pt   Hemorrhoids    Hypertension    Hypothyroidism    Nodule   Kidney stone    Osteopenia 12/2018   T score -1.8 FRAX 9.1% / 4.2%   Rectocele    Varicose veins     Past Surgical History:  Procedure Laterality Date   CHOLECYSTECTOMY     spinal infusion  11/19/2022   Thyroid nodule Bx     Benign   TUBAL LIGATION      Current Outpatient Medications  Medication Sig Dispense Refill   alendronate (FOSAMAX) 70 MG tablet TAKE 1 TABLET(70 MG) BY MOUTH EVERY 7 DAYS WITH A FULL GLASS OF WATER AND ON AN EMPTY STOMACH 12 tablet 3   amLODipine (NORVASC) 2.5 MG tablet Take 2.5 mg  by mouth daily.     ascorbic acid (C 500/ROSE HIPS) 500 MG tablet Take by mouth.     Calcium Carbonate-Vit D-Min (CALTRATE PLUS PO) Take 1 tablet by mouth 2 (two) times daily.     fluticasone (FLONASE) 50 MCG/ACT nasal spray Place 1 spray into both nostrils daily as needed for allergies or rhinitis.     hydrocortisone (ANUSOL-HC) 2.5 % rectal cream APPLY RECTALLY TO THE AFFECTED AREA TWICE DAILY FOR 14 DAYS 30 g 1   multivitamin (THERAGRAN) per tablet Take 1 tablet by mouth daily.       Olopatadine HCl 0.2 % SOLN 1 drop     Psyllium (METAMUCIL PO) Take by mouth every other day.     rosuvastatin (CRESTOR) 40 MG tablet Take 1 tablet (40 mg total) by mouth daily. 90 tablet 3   SYNTHROID 112 MCG tablet Take 112 mcg by mouth every morning.     No current facility-administered medications for this visit.    ALLERGIES: Sulfa antibiotics, Tramadol hcl, Kapidex [dexlansoprazole], and Sulfonamide derivatives  Family History  Problem Relation Age of Onset   Diabetes Mother    Hypertension Mother    Uterine cancer Mother    Heart disease Mother    Cancer Father         prostate   Colon cancer Paternal Aunt    Heart attack Brother    Cancer Brother        Prostate   Cancer Daughter        Thyroid cancer   Cancer Brother        Prostate   Cancer Brother        Prostate    Review of Systems  All other systems reviewed and are negative.   PHYSICAL EXAM:  BP 116/70 (BP Location: Right Arm, Patient Position: Sitting, Cuff Size: Normal)   Pulse 72   Ht 5' 2.25" (1.581 m)   Wt 164 lb (74.4 kg)   SpO2 96%   BMI 29.76 kg/m     General appearance: alert, cooperative and appears stated age Head: normocephalic, without obvious abnormality, atraumatic Neck: no adenopathy, supple, symmetrical, trachea midline and thyroid normal to inspection and palpation Lungs: clear to auscultation bilaterally Breasts: normal appearance, no masses or tenderness, No nipple retraction or dimpling, No nipple discharge or bleeding, No axillary adenopathy Heart: regular rate and rhythm Abdomen: soft, non-tender; no masses, no organomegaly Extremities: extremities normal, atraumatic, no cyanosis or edema Skin: skin color, texture, turgor normal. No rashes or lesions Lymph nodes: cervical, supraclavicular, and axillary nodes normal. Neurologic: grossly normal  Pelvic: External genitalia:  no lesions              No abnormal inguinal nodes palpated.              Urethra:  normal appearing urethra with no masses, tenderness or lesions              Bartholins and Skenes: normal                 Vagina: normal appearing vagina with normal color and discharge, no lesions.  Second degree rectocele.              Cervix: no lesions              Pap taken: No. Bimanual Exam:  Uterus:  normal size, contour, position, consistency, mobility, non-tender              Adnexa: no mass, fullness,  tenderness              Rectal exam: Yes.  .  Confirms.              Anus:  normal sphincter tone, no lesions  Chaperone was present for exam:  Warren Lacy, CMA  PHQ-9 -  1  ASSESSMENT: Encounter for breast and pelvic exam. Menopause.  Second degree rectocele.  Stable. Left ovarian fibroma.  Status post consultation with GYN ONC.  Osteopenia.  On Fosamax.  Doing well.  FH cancers.   PLAN: Mammogram screening discussed. Self breast awareness reviewed. Pap and HRV collected:  No.  Due in 2028.  Guidelines for Calcium, Vitamin D, regular exercise program including cardiovascular and weight bearing exercise. Medication refills:  Fosamax 70 mg weekly for one year.  Tx options for rectocele discussed:  pelvic floor therapy, pessary, surgery.  No change in current care plan. BMD ordered.  Patient will call to schedule. Follow up:  1 year    Additional counseling given.  Yes.  . 20 min  total time was spent for this patient encounter, including preparation, face-to-face counseling with the patient, coordination of care, and documentation of the encounter in addition to doing the breast and pelvic exam.   An After Visit Summary was provided to the patient.

## 2023-01-19 ENCOUNTER — Ambulatory Visit (INDEPENDENT_AMBULATORY_CARE_PROVIDER_SITE_OTHER): Payer: Medicare Other | Admitting: Obstetrics and Gynecology

## 2023-01-19 ENCOUNTER — Encounter: Payer: Self-pay | Admitting: Obstetrics and Gynecology

## 2023-01-19 VITALS — BP 116/70 | HR 72 | Ht 62.25 in | Wt 164.0 lb

## 2023-01-19 DIAGNOSIS — Z01419 Encounter for gynecological examination (general) (routine) without abnormal findings: Secondary | ICD-10-CM | POA: Diagnosis not present

## 2023-01-19 DIAGNOSIS — M8580 Other specified disorders of bone density and structure, unspecified site: Secondary | ICD-10-CM

## 2023-01-19 DIAGNOSIS — Z780 Asymptomatic menopausal state: Secondary | ICD-10-CM

## 2023-01-19 DIAGNOSIS — N816 Rectocele: Secondary | ICD-10-CM

## 2023-01-19 DIAGNOSIS — Z5181 Encounter for therapeutic drug level monitoring: Secondary | ICD-10-CM | POA: Diagnosis not present

## 2023-01-19 MED ORDER — ALENDRONATE SODIUM 70 MG PO TABS: ORAL_TABLET | ORAL | 3 refills | Status: DC

## 2023-01-19 NOTE — Patient Instructions (Signed)
EXERCISE AND DIET:  We recommended that you start or continue a regular exercise program for good health. Regular exercise means any activity that makes your heart beat faster and makes you sweat.  We recommend exercising at least 30 minutes per day at least 3 days a week, preferably 4 or 5.  We also recommend a diet low in fat and sugar.  Inactivity, poor dietary choices and obesity can cause diabetes, heart attack, stroke, and kidney damage, among others.    ALCOHOL AND SMOKING:  Women should limit their alcohol intake to no more than 7 drinks/beers/glasses of wine (combined, not each!) per week. Moderation of alcohol intake to this level decreases your risk of breast cancer and liver damage. And of course, no recreational drugs are part of a healthy lifestyle.  And absolutely no smoking or even second hand smoke. Most people know smoking can cause heart and lung diseases, but did you know it also contributes to weakening of your bones? Aging of your skin?  Yellowing of your teeth and nails?  CALCIUM AND VITAMIN D:  Adequate intake of calcium and Vitamin D are recommended.  The recommendations for exact amounts of these supplements seem to change often, but generally speaking 600 mg of calcium (either carbonate or citrate) and 800 units of Vitamin D per day seems prudent. Certain women may benefit from higher intake of Vitamin D.  If you are among these women, your doctor will have told you during your visit.    PAP SMEARS:  Pap smears, to check for cervical cancer or precancers,  have traditionally been done yearly, although recent scientific advances have shown that most women can have pap smears less often.  However, every woman still should have a physical exam from her gynecologist every year. It will include a breast check, inspection of the vulva and vagina to check for abnormal growths or skin changes, a visual exam of the cervix, and then an exam to evaluate the size and shape of the uterus and  ovaries.  And after 78 years of age, a rectal exam is indicated to check for rectal cancers. We will also provide age appropriate advice regarding health maintenance, like when you should have certain vaccines, screening for sexually transmitted diseases, bone density testing, colonoscopy, mammograms, etc.   MAMMOGRAMS:  All women over 78 years old should have a yearly mammogram. Many facilities now offer a "3D" mammogram, which may cost around $50 extra out of pocket. If possible,  we recommend you accept the option to have the 3D mammogram performed.  It both reduces the number of women who will be called back for extra views which then turn out to be normal, and it is better than the routine mammogram at detecting truly abnormal areas.    COLONOSCOPY:  Colonoscopy to screen for colon cancer is recommended for all women at age 78.  We know, you hate the idea of the prep.  We agree, BUT, having colon cancer and not knowing it is worse!!  Colon cancer so often starts as a polyp that can be seen and removed at colonscopy, which can quite literally save your life!  And if your first colonoscopy is normal and you have no family history of colon cancer, most women don't have to have it again for 10 years.  Once every ten years, you can do something that may end up saving your life, right?  We will be happy to help you get it scheduled when you are ready.  Be sure to check your insurance coverage so you understand how much it will cost.  It may be covered as a preventative service at no cost, but you should check your particular policy.     About Rectocele  Overview  A rectocele is a type of hernia which causes different degrees of bulging of the rectal tissues into the vaginal wall.  You may even notice that it presses against the vaginal wall so much that some vaginal tissues droop outside of the opening of your vagina.  Causes of Rectocele  The most common cause is childbirth.  The muscles and ligaments  in the pelvis that hold up and support the female organs and vagina become stretched and weakened during labor and delivery.  The more babies you have, the more the support tissues are stretched and weakened.  Not everyone who has a baby will develop a rectocele.  Some women have stronger supporting tissue in the pelvis and may not have as much of a problem as others.  Women who have a Cesarean section usually do not get rectocele's unless they pushed a long time prior to the cesarean delivery.  Other conditions that can cause a rectocele include chronic constipation, a chronic cough, a lot of heavy lifting, and obesity.  Older women may have this problem because the loss of female hormones causes the vaginal tissue to become weaker.  Symptoms  There may not be any symptoms.  If you do have symptoms, they may include:  Pelvic pressure in the rectal area  Protrusion of the lower part of the vagina through the opening of the vagina  Constipation and trapping of the stool, making it difficult to have a bowel movement.  In severe cases, you may have to press on the lower part of your vagina to help push the stool out of you rectum.  This is called splinting to empty.  Diagnosing Rectocele  Your health care provider will ask about your symptoms and perform a pelvic exam.  S/he will ask you to bear down, pushing like you are having a bowel movement so as to see how far the lower part of the vagina protrudes into the vagina and possible outside of the vagina.  Your provider will also ask you to contract the muscles of your pelvis (like you are stopping the stream in the middle of urinating) to determine the strength of your pelvic muscles.  Your provider may also do a rectal exam.  Treatment Options  If you do not have any symptoms, no treatment may be necessary.  Other treatment options include:  Pelvic floor exercises: Contracting the muscles in your genital area may help strengthen your muscles and  support the organs.  Be sure to get proper exercise instruction from you physical therapist.  A pessary (removealbe pelvic support device) sometimes helps rectocele symptoms.  Surgery: Surgical repair may be necessary. In some cases the uterus may need to be taken out ( a hysterectomy) as well.  There are many types of surgery for pelvic support problems.  Look for physicians who specialize in repair procedures.  You can take care of yourself by:  Treating and preventing constipation  Avoiding heavy lifting, and lifting correctly (with your legs, not with you waist or back)  Treating a chronic cough or bronchitis  Not smoking  avoiding too much weight gain  Doing pelvic floor exercises   2007, [employer] Doc.33

## 2023-02-03 DIAGNOSIS — M1711 Unilateral primary osteoarthritis, right knee: Secondary | ICD-10-CM | POA: Diagnosis not present

## 2023-02-12 DIAGNOSIS — Z0000 Encounter for general adult medical examination without abnormal findings: Secondary | ICD-10-CM | POA: Diagnosis not present

## 2023-02-12 DIAGNOSIS — E039 Hypothyroidism, unspecified: Secondary | ICD-10-CM | POA: Diagnosis not present

## 2023-02-12 DIAGNOSIS — E782 Mixed hyperlipidemia: Secondary | ICD-10-CM | POA: Diagnosis not present

## 2023-02-12 DIAGNOSIS — M1712 Unilateral primary osteoarthritis, left knee: Secondary | ICD-10-CM | POA: Diagnosis not present

## 2023-02-12 DIAGNOSIS — I10 Essential (primary) hypertension: Secondary | ICD-10-CM | POA: Diagnosis not present

## 2023-02-12 DIAGNOSIS — Z23 Encounter for immunization: Secondary | ICD-10-CM | POA: Diagnosis not present

## 2023-02-12 DIAGNOSIS — Z6829 Body mass index (BMI) 29.0-29.9, adult: Secondary | ICD-10-CM | POA: Diagnosis not present

## 2023-02-24 DIAGNOSIS — M1711 Unilateral primary osteoarthritis, right knee: Secondary | ICD-10-CM | POA: Diagnosis not present

## 2023-02-26 DIAGNOSIS — R059 Cough, unspecified: Secondary | ICD-10-CM | POA: Diagnosis not present

## 2023-02-26 DIAGNOSIS — Z6830 Body mass index (BMI) 30.0-30.9, adult: Secondary | ICD-10-CM | POA: Diagnosis not present

## 2023-03-10 DIAGNOSIS — M6281 Muscle weakness (generalized): Secondary | ICD-10-CM | POA: Diagnosis not present

## 2023-03-10 DIAGNOSIS — M1711 Unilateral primary osteoarthritis, right knee: Secondary | ICD-10-CM | POA: Diagnosis not present

## 2023-03-20 DIAGNOSIS — M6281 Muscle weakness (generalized): Secondary | ICD-10-CM | POA: Diagnosis not present

## 2023-03-20 DIAGNOSIS — M1711 Unilateral primary osteoarthritis, right knee: Secondary | ICD-10-CM | POA: Diagnosis not present

## 2023-03-26 DIAGNOSIS — M1711 Unilateral primary osteoarthritis, right knee: Secondary | ICD-10-CM | POA: Diagnosis not present

## 2023-03-26 DIAGNOSIS — M6281 Muscle weakness (generalized): Secondary | ICD-10-CM | POA: Diagnosis not present

## 2023-04-01 DIAGNOSIS — E041 Nontoxic single thyroid nodule: Secondary | ICD-10-CM | POA: Diagnosis not present

## 2023-04-01 DIAGNOSIS — E039 Hypothyroidism, unspecified: Secondary | ICD-10-CM | POA: Diagnosis not present

## 2023-04-01 DIAGNOSIS — Z8639 Personal history of other endocrine, nutritional and metabolic disease: Secondary | ICD-10-CM | POA: Diagnosis not present

## 2023-04-02 DIAGNOSIS — M1711 Unilateral primary osteoarthritis, right knee: Secondary | ICD-10-CM | POA: Diagnosis not present

## 2023-04-02 DIAGNOSIS — M6281 Muscle weakness (generalized): Secondary | ICD-10-CM | POA: Diagnosis not present

## 2023-04-08 DIAGNOSIS — I10 Essential (primary) hypertension: Secondary | ICD-10-CM | POA: Diagnosis not present

## 2023-04-08 DIAGNOSIS — Z6830 Body mass index (BMI) 30.0-30.9, adult: Secondary | ICD-10-CM | POA: Diagnosis not present

## 2023-04-09 DIAGNOSIS — M6281 Muscle weakness (generalized): Secondary | ICD-10-CM | POA: Diagnosis not present

## 2023-04-09 DIAGNOSIS — M1711 Unilateral primary osteoarthritis, right knee: Secondary | ICD-10-CM | POA: Diagnosis not present

## 2023-04-23 DIAGNOSIS — M1711 Unilateral primary osteoarthritis, right knee: Secondary | ICD-10-CM | POA: Diagnosis not present

## 2023-04-23 DIAGNOSIS — M6281 Muscle weakness (generalized): Secondary | ICD-10-CM | POA: Diagnosis not present

## 2023-04-30 DIAGNOSIS — M6281 Muscle weakness (generalized): Secondary | ICD-10-CM | POA: Diagnosis not present

## 2023-04-30 DIAGNOSIS — M1711 Unilateral primary osteoarthritis, right knee: Secondary | ICD-10-CM | POA: Diagnosis not present

## 2023-05-12 DIAGNOSIS — Z6830 Body mass index (BMI) 30.0-30.9, adult: Secondary | ICD-10-CM | POA: Diagnosis not present

## 2023-05-12 DIAGNOSIS — I10 Essential (primary) hypertension: Secondary | ICD-10-CM | POA: Diagnosis not present

## 2023-05-12 DIAGNOSIS — H1132 Conjunctival hemorrhage, left eye: Secondary | ICD-10-CM | POA: Diagnosis not present

## 2023-05-19 DIAGNOSIS — Z1231 Encounter for screening mammogram for malignant neoplasm of breast: Secondary | ICD-10-CM | POA: Diagnosis not present

## 2023-05-19 NOTE — Progress Notes (Unsigned)
 Cardiology Office Note    Patient Name: Lynn Collins Date of Encounter: 05/19/2023  Primary Care Provider:  Daisy Floro, MD Primary Cardiologist:  None Primary Electrophysiologist: None   Past Medical History    Past Medical History:  Diagnosis Date   Arthritis    Atrophic vaginitis    Diverticulitis    Exposure to TB    possible per pt   Hemorrhoids    Hypertension    Hypothyroidism    Nodule   Kidney stone    Osteopenia 12/2018   T score -1.8 FRAX 9.1% / 4.2%   Rectocele    Varicose veins     History of Present Illness  Lynn Collins is a 78 y.o. female with a PMH of HTN, hypothyroidism, HLD, CAD with coronary calcifications, aortic atherosclerosis, left anterior hemiblock who presents today for overdue follow-up.  Lynn Collins was seen by Dr. Katrinka Blazing on 11/11/2021 for initial visit and evaluation of elevated calcium score.  During the visit she denied any orthopnea, or exertional chest pain.  She completed a calcium score on 09/2021 that showed aortic atherosclerosis with scattered calcifications and total Acticin score of 74.9.  She had a 2D echo completed as well that showed EF of 60-65% with no RWMA and mild thickening of the AVS with trivial AVR.  She was seen in the ED on 11/13/2021 with complaint of chest pain completed D-dimer that was mildly elevated.  She had a CT of the chest completed that showed no evidence of PE.  She had EKG completed that showed no ischemic changes and troponins were negative x 2.  She was advised to follow-up with cardiology.    Patient denies chest pain, palpitations, dyspnea, PND, orthopnea, nausea, vomiting, dizziness, syncope, edema, weight gain, or early satiety.   Discussed the use of AI scribe software for clinical note transcription with the patient, who gave verbal consent to proceed.  History of Present Illness    ***Notes: -Last ischemic evaluation:  Review of Systems  Please see the history of present  illness.    All other systems reviewed and are otherwise negative except as noted above.  Physical Exam    Wt Readings from Last 3 Encounters:  01/19/23 164 lb (74.4 kg)  05/14/22 158 lb (71.7 kg)  01/07/22 161 lb (73 kg)   VW:UJWJX were no vitals filed for this visit.,There is no height or weight on file to calculate BMI. GEN: Well nourished, well developed in no acute distress Neck: No JVD; No carotid bruits Pulmonary: Clear to auscultation without rales, wheezing or rhonchi  Cardiovascular: Normal rate. Regular rhythm. Normal S1. Normal S2.   Murmurs: There is no murmur.  ABDOMEN: Soft, non-tender, non-distended EXTREMITIES:  No edema; No deformity   EKG/LABS/ Recent Cardiac Studies   ECG personally reviewed by me today - ***  Risk Assessment/Calculations:   {Does this patient have ATRIAL FIBRILLATION?[phone]}      Lab Results  Component Value Date   WBC 10.1 11/17/2021   HGB 13.8 11/17/2021   HCT 42.2 11/17/2021   MCV 92.1 11/17/2021   PLT 268 11/17/2021   Lab Results  Component Value Date   CREATININE 0.73 12/23/2021   BUN 12 12/23/2021   NA 141 12/23/2021   K 3.9 12/23/2021   CL 104 12/23/2021   CO2 28 12/23/2021   Lab Results  Component Value Date   CHOL 116 12/23/2021   HDL 45 12/23/2021   LDLCALC 56 12/23/2021   TRIG 75 12/23/2021  CHOLHDL 2.6 12/23/2021    No results found for: "HGBA1C" Assessment & Plan    1.  Essential hypertension  2.  Hyperlipidemia  3.  Aortic atherosclerosis  4.***      Disposition: Follow-up with None or APP in *** months {Are you ordering a CV Procedure (e.g. stress test, cath, DCCV, TEE, etc)?   Press F2        :[id number]}   Signed, Napoleon Form, Leodis Rains, NP 05/19/2023, 7:22 PM [facility]

## 2023-05-20 ENCOUNTER — Encounter: Payer: Self-pay | Admitting: Nurse Practitioner

## 2023-05-20 ENCOUNTER — Ambulatory Visit: Payer: Medicare Other | Attending: Nurse Practitioner | Admitting: Nurse Practitioner

## 2023-05-20 VITALS — BP 110/68 | HR 73 | Ht 62.25 in | Wt 167.0 lb

## 2023-05-20 DIAGNOSIS — I700 Atherosclerosis of aorta: Secondary | ICD-10-CM | POA: Insufficient documentation

## 2023-05-20 DIAGNOSIS — I10 Essential (primary) hypertension: Secondary | ICD-10-CM | POA: Insufficient documentation

## 2023-05-20 DIAGNOSIS — E785 Hyperlipidemia, unspecified: Secondary | ICD-10-CM | POA: Diagnosis not present

## 2023-05-20 DIAGNOSIS — R011 Cardiac murmur, unspecified: Secondary | ICD-10-CM | POA: Insufficient documentation

## 2023-05-20 NOTE — Patient Instructions (Signed)
 Medication Instructions:  Your physician recommends that you continue on your current medications as directed. Please refer to the Current Medication list given to you today. *If you need a refill on your cardiac medications before your next appointment, please call your pharmacy*   Lab Work: None ordered If you have labs (blood work) drawn today and your tests are completely normal, you will receive your results only by: MyChart Message (if you have MyChart) OR A paper copy in the mail If you have any lab test that is abnormal or we need to change your treatment, we will call you to review the results.   Testing/Procedures: Your physician has requested that you have an echocardiogram. Echocardiography is a painless test that uses sound waves to create images of your heart. It provides your doctor with information about the size and shape of your heart and how well your heart's chambers and valves are working. This procedure takes approximately one hour. There are no restrictions for this procedure. Please do NOT wear cologne, perfume, aftershave, or lotions (deodorant is allowed). Please arrive 15 minutes prior to your appointment time.  Please note: We ask at that you not bring children with you during ultrasound (echo/ vascular) testing. Due to room size and safety concerns, children are not allowed in the ultrasound rooms during exams. Our front office staff cannot provide observation of children in our lobby area while testing is being conducted. An adult accompanying a patient to their appointment will only be allowed in the ultrasound room at the discretion of the ultrasound technician under special circumstances. We apologize for any inconvenience.   Follow-Up: At [facility], you and your health needs are our priority.  As part of our continuing mission to provide you with exceptional heart care, we have created designated Provider Care Teams.  These Care Teams include your  primary Cardiologist (physician) and Advanced Practice Providers (APPs -  Physician Assistants and Nurse Practitioners) who all work together to provide you with the care you need, when you need it.  We recommend signing up for the patient portal called "MyChart".  Sign up information is provided on this After Visit Summary.  MyChart is used to connect with patients for Virtual Visits (Telemedicine).  Patients are able to view lab/test results, encounter notes, upcoming appointments, etc.  Non-urgent messages can be sent to your provider as well.   To learn more about what you can do with MyChart, go to [email or web address].    Your next appointment:   6 month(s)  Provider:   Thomasene Ripple, MD     Other Instructions    1st Floor: - Lobby - Registration  - Pharmacy  - Lab - Cafe  2nd Floor: - PV Lab - Diagnostic Testing (echo, CT, nuclear med)  3rd Floor: - Vacant  4th Floor: - TCTS (cardiothoracic surgery) - AFib Clinic - Structural Heart Clinic - Vascular Surgery  - Vascular Ultrasound  5th Floor: - HeartCare Cardiology (general and EP) - Clinical Pharmacy for coumadin, hypertension, lipid, weight-loss medications, and med management appointments    Valet parking services will be available as well.

## 2023-06-17 DIAGNOSIS — E042 Nontoxic multinodular goiter: Secondary | ICD-10-CM | POA: Diagnosis not present

## 2023-06-17 DIAGNOSIS — E039 Hypothyroidism, unspecified: Secondary | ICD-10-CM | POA: Diagnosis not present

## 2023-06-17 DIAGNOSIS — E559 Vitamin D deficiency, unspecified: Secondary | ICD-10-CM | POA: Diagnosis not present

## 2023-06-25 ENCOUNTER — Ambulatory Visit (HOSPITAL_COMMUNITY)

## 2023-07-28 ENCOUNTER — Ambulatory Visit (HOSPITAL_COMMUNITY)
Admission: RE | Admit: 2023-07-28 | Discharge: 2023-07-28 | Disposition: A | Discharge status: Home / Self Care | Source: Ambulatory Visit | Attending: Cardiovascular Disease | Admitting: Cardiovascular Disease

## 2023-07-28 ENCOUNTER — Ambulatory Visit: Payer: Self-pay | Admitting: Nurse Practitioner

## 2023-07-28 DIAGNOSIS — I700 Atherosclerosis of aorta: Secondary | ICD-10-CM

## 2023-07-28 DIAGNOSIS — I080 Rheumatic disorders of both mitral and aortic valves: Secondary | ICD-10-CM | POA: Diagnosis not present

## 2023-07-28 DIAGNOSIS — I10 Essential (primary) hypertension: Secondary | ICD-10-CM | POA: Diagnosis not present

## 2023-07-28 DIAGNOSIS — I517 Cardiomegaly: Secondary | ICD-10-CM | POA: Diagnosis not present

## 2023-07-28 DIAGNOSIS — I5030 Unspecified diastolic (congestive) heart failure: Secondary | ICD-10-CM | POA: Diagnosis not present

## 2023-07-28 DIAGNOSIS — E785 Hyperlipidemia, unspecified: Secondary | ICD-10-CM | POA: Diagnosis not present

## 2023-07-28 LAB — ECHOCARDIOGRAM COMPLETE
AR max vel: 1.71 cm2
AV Area VTI: 1.66 cm2
AV Area mean vel: 1.84 cm2
AV Mean grad: 10 mmHg
AV Peak grad: 20.6 mmHg
Ao pk vel: 2.27 m/s
Area-P 1/2: 2.8 cm2
P 1/2 time: 425 ms
S' Lateral: 2 cm

## 2023-08-26 DIAGNOSIS — H40013 Open angle with borderline findings, low risk, bilateral: Secondary | ICD-10-CM | POA: Diagnosis not present

## 2023-08-26 DIAGNOSIS — H25813 Combined forms of age-related cataract, bilateral: Secondary | ICD-10-CM | POA: Diagnosis not present

## 2023-09-09 DIAGNOSIS — J320 Chronic maxillary sinusitis: Secondary | ICD-10-CM | POA: Diagnosis not present

## 2023-09-09 DIAGNOSIS — I10 Essential (primary) hypertension: Secondary | ICD-10-CM | POA: Diagnosis not present

## 2023-09-09 DIAGNOSIS — E039 Hypothyroidism, unspecified: Secondary | ICD-10-CM | POA: Diagnosis not present

## 2023-09-28 DIAGNOSIS — Z6831 Body mass index (BMI) 31.0-31.9, adult: Secondary | ICD-10-CM | POA: Diagnosis not present

## 2023-09-28 DIAGNOSIS — R059 Cough, unspecified: Secondary | ICD-10-CM | POA: Diagnosis not present

## 2023-10-28 DIAGNOSIS — Z23 Encounter for immunization: Secondary | ICD-10-CM | POA: Diagnosis not present

## 2023-11-03 DIAGNOSIS — E042 Nontoxic multinodular goiter: Secondary | ICD-10-CM | POA: Diagnosis not present

## 2023-11-03 DIAGNOSIS — J029 Acute pharyngitis, unspecified: Secondary | ICD-10-CM | POA: Diagnosis not present

## 2023-11-03 DIAGNOSIS — E039 Hypothyroidism, unspecified: Secondary | ICD-10-CM | POA: Diagnosis not present

## 2023-11-03 DIAGNOSIS — E559 Vitamin D deficiency, unspecified: Secondary | ICD-10-CM | POA: Diagnosis not present

## 2023-11-10 DIAGNOSIS — E039 Hypothyroidism, unspecified: Secondary | ICD-10-CM | POA: Diagnosis not present

## 2023-11-10 DIAGNOSIS — R051 Acute cough: Secondary | ICD-10-CM | POA: Diagnosis not present

## 2023-11-10 DIAGNOSIS — J302 Other seasonal allergic rhinitis: Secondary | ICD-10-CM | POA: Diagnosis not present

## 2023-11-18 ENCOUNTER — Encounter: Payer: Self-pay | Admitting: *Deleted

## 2023-11-18 DIAGNOSIS — J209 Acute bronchitis, unspecified: Secondary | ICD-10-CM | POA: Diagnosis not present

## 2023-11-18 DIAGNOSIS — R051 Acute cough: Secondary | ICD-10-CM | POA: Diagnosis not present

## 2023-11-19 ENCOUNTER — Encounter: Payer: Self-pay | Admitting: Cardiology

## 2023-11-19 ENCOUNTER — Ambulatory Visit: Attending: Cardiology | Admitting: Cardiology

## 2023-11-19 VITALS — BP 130/80 | HR 60 | Ht 63.0 in | Wt 167.0 lb

## 2023-11-19 DIAGNOSIS — E785 Hyperlipidemia, unspecified: Secondary | ICD-10-CM | POA: Diagnosis not present

## 2023-11-19 DIAGNOSIS — Z79899 Other long term (current) drug therapy: Secondary | ICD-10-CM | POA: Diagnosis not present

## 2023-11-19 DIAGNOSIS — I350 Nonrheumatic aortic (valve) stenosis: Secondary | ICD-10-CM | POA: Diagnosis not present

## 2023-11-19 NOTE — Progress Notes (Signed)
 Cardiology Office Note:    Date:  11/21/2023   ID:  Lynn Collins, DOB [birth year], MRN [id number]  PCP:  Okey Carlin Redbird, MD  Cardiologist:  Dub Huntsman, DO  Electrophysiologist:  None   Referring MD: Okey Carlin Redbird, MD    I am   History of Present Illness:    Lynn Collins is a 78 y.o. female with a hx of coronary calcification by CT,  Mild aortic stenosis , HTN, hypothyroidism, HLD, CAD with coronary calcifications, aortic atherosclerosis, left anterior hemiblock who presents today.   She has been seen in the office but this is my first visit with her.    Past Medical History:  Diagnosis Date   Arthritis    Atrophic vaginitis    Diverticulitis    Exposure to TB    possible per pt   Hemorrhoids    Hypertension    Hypothyroidism    Nodule   Kidney stone    Osteopenia 12/2018   T score -1.8 FRAX 9.1% / 4.2%   Rectocele    Varicose veins     Past Surgical History:  Procedure Laterality Date   CHOLECYSTECTOMY     spinal infusion  11/19/2022   Thyroid  nodule Bx     Benign   TUBAL LIGATION      Current Medications: Current Meds  Medication Sig   alendronate  (FOSAMAX ) 70 MG tablet TAKE 1 TABLET(70 MG) BY MOUTH EVERY 7 DAYS WITH A FULL GLASS OF WATER AND ON AN EMPTY STOMACH   amLODipine (NORVASC) 2.5 MG tablet Take 2.5 mg by mouth 2 (two) times daily.   ascorbic acid (C 500/ROSE HIPS) 500 MG tablet Take by mouth.   azithromycin (ZITHROMAX) 250 MG tablet Take 250 mg by mouth daily.   Calcium  Carbonate-Vit D-Min (CALTRATE PLUS PO) Take 1 tablet by mouth 2 (two) times daily.   fluticasone (FLONASE) 50 MCG/ACT nasal spray Place 1 spray into both nostrils daily as needed for allergies or rhinitis.   hydrocortisone (ANUSOL-HC) 2.5 % rectal cream APPLY RECTALLY TO THE AFFECTED AREA TWICE DAILY FOR 14 DAYS   multivitamin (THERAGRAN) per tablet Take 1 tablet by mouth daily.     Olopatadine HCl 0.2 % SOLN 1 drop   Psyllium (METAMUCIL PO) Take by mouth every  other day.   rosuvastatin  (CRESTOR ) 40 MG tablet Take 1 tablet (40 mg total) by mouth daily.   SYNTHROID 112 MCG tablet Take 112 mcg by mouth every morning.     Allergies:   Sulfa antibiotics, Tramadol hcl, Kapidex [dexlansoprazole], and Sulfonamide derivatives   Social History   Socioeconomic History   Marital status: Married    Spouse name: Not on file   Number of children: 3   Years of education: 18   Highest education level: Not on file  Occupational History   Occupation: retired   Tobacco Use   Smoking status: Never   Smokeless tobacco: Never  Vaping Use   Vaping status: Never Used  Substance and Sexual Activity   Alcohol use: Not Currently   Drug use: No   Sexual activity: Not Currently    Birth control/protection: Surgical    Comment: Tubal lig-1st intercourse 20 yo-1 partner  Other Topics Concern   Not on file  Social History Narrative   Right handed   Two story home   Drinks caffeine   Social Drivers of Health   Financial Resource Strain: Low Risk  (09/01/2022)   Received from [facility]  Overall Financial Resource Strain (CARDIA)    Difficulty of Paying Living Expenses: Not hard at all  Food Insecurity: No Food Insecurity (09/01/2022)   Received from [facility]   Hunger Vital Sign    Within the past 12 months, you worried that your food would run out before you got the money to buy more.: Never true    Within the past 12 months, the food you bought just didn't last and you didn't have money to get more.: Never true  Transportation Needs: No Transportation Needs (09/01/2022)   Received from [facility] - Transportation    In the past 12 months, has lack of transportation kept you from medical appointments or from getting medications?: No    Lack of Transportation (Non-Medical): No  Physical Activity: Not on file  Stress: Not on file  Social Connections: Not on file     Family History: The  patient's family history includes Cancer in her brother, brother, brother, daughter, and father; Colon cancer in her paternal aunt; Diabetes in her mother; Heart attack in her brother; Heart disease in her mother; Hypertension in her mother; Uterine cancer in her mother.  ROS:   Review of Systems  Constitution: Negative for decreased appetite, fever and weight gain.  HENT: Negative for congestion, ear discharge, hoarse voice and sore throat.   Eyes: Negative for discharge, redness, vision loss in right eye and visual halos.  Cardiovascular: Negative for chest pain, dyspnea on exertion, leg swelling, orthopnea and palpitations.  Respiratory: Negative for cough, hemoptysis, shortness of breath and snoring.   Endocrine: Negative for heat intolerance and polyphagia.  Hematologic/Lymphatic: Negative for bleeding problem. Does not bruise/bleed easily.  Skin: Negative for flushing, nail changes, rash and suspicious lesions.  Musculoskeletal: Negative for arthritis, joint pain, muscle cramps, myalgias, neck pain and stiffness.  Gastrointestinal: Negative for abdominal pain, bowel incontinence, diarrhea and excessive appetite.  Genitourinary: Negative for decreased libido, genital sores and incomplete emptying.  Neurological: Negative for brief paralysis, focal weakness, headaches and loss of balance.  Psychiatric/Behavioral: Negative for altered mental status, depression and suicidal ideas.  Allergic/Immunologic: Negative for HIV exposure and persistent infections.    EKGs/Labs/Other Studies Reviewed:    The following studies were reviewed today:   EKG:  The ekg ordered today demonstrates   Recent Labs: No results found for requested labs within last 365 days.  Recent Lipid Panel    Component Value Date/Time   CHOL 106 11/19/2023 1206   TRIG 74 11/19/2023 1206   HDL 41 11/19/2023 1206   CHOLHDL 2.6 11/19/2023 1206   LDLCALC 50 11/19/2023 1206    Physical Exam:    VS:  BP 130/80 (BP  Location: Right Arm, Patient Position: Sitting, Cuff Size: Normal)   Pulse 60   Ht 5' 3 (1.6 m)   Wt 167 lb (75.8 kg)   SpO2 94%   BMI 29.58 kg/m     Wt Readings from Last 3 Encounters:  11/19/23 167 lb (75.8 kg)  05/20/23 167 lb (75.8 kg)  01/19/23 164 lb (74.4 kg)     GEN: Well nourished, well developed in no acute distress HEENT: Normal NECK: No JVD; No carotid bruits LYMPHATICS: No lymphadenopathy CARDIAC: S1S2 noted,RRR, no murmurs, rubs, gallops RESPIRATORY:  Clear to auscultation without rales, wheezing or rhonchi  ABDOMEN: Soft, non-tender, non-distended, +bowel sounds, no guarding. EXTREMITIES: No edema, No cyanosis, no clubbing MUSCULOSKELETAL:  No deformity  SKIN: Warm and dry NEUROLOGIC:  Alert and  oriented x 3, non-focal PSYCHIATRIC:  Normal affect, good insight  ASSESSMENT:    1. Medication management   2. Hyperlipidemia LDL goal <70   3. Mild aortic stenosis    PLAN:    Mild aortic stenosis - noted on recently echo.   Hyperlipidemia - continue current dose of crestor  40 mg daily.   Aortic atherosclerosis - continue current medications regimen  Lifestyle modification advised.   The patient is in agreement with the above plan. The patient left the office in stable condition.  The patient will follow up in   Medication Adjustments/Labs and Tests Ordered: Current medicines are reviewed at length with the patient today.  Concerns regarding medicines are outlined above.  Orders Placed This Encounter  Procedures   Lipid panel   Lipoprotein A (LPA)   No orders of the defined types were placed in this encounter.   Patient Instructions  Medication Instructions:  Your physician recommends that you continue on your current medications as directed. Please refer to the Current Medication list given to you today.  *If you need a refill on your cardiac medications before your next appointment, please call your pharmacy*  Lab Work: Lipids, Lp(a) If you  have labs (blood work) drawn today and your tests are completely normal, you will receive your results only by: MyChart Message (if you have MyChart) OR A paper copy in the mail If you have any lab test that is abnormal or we need to change your treatment, we will call you to review the results.  Follow-Up: At [facility], you and your health needs are our priority.  As part of our continuing mission to provide you with exceptional heart care, our providers are all part of one team.  This team includes your primary Cardiologist (physician) and Advanced Practice Providers or APPs (Physician Assistants and Nurse Practitioners) who all work together to provide you with the care you need, when you need it.  Your next appointment:   1 year(s)  Provider:   Janessa Mickle, DO              Adopting a Healthy Lifestyle.  Know what a healthy weight is for you (roughly BMI <25) and aim to maintain this   Aim for 7+ servings of fruits and vegetables daily   65-80+ fluid ounces of water or unsweet tea for healthy kidneys   Limit to max 1 drink of alcohol per day; avoid smoking/tobacco   Limit animal fats in diet for cholesterol and heart health - choose grass fed whenever available   Avoid highly processed foods, and foods high in saturated/trans fats   Aim for low stress - take time to unwind and care for your mental health   Aim for 150 min of moderate intensity exercise weekly for heart health, and weights twice weekly for bone health   Aim for 7-9 hours of sleep daily   When it comes to diets, agreement about the perfect plan isnt easy to find, even among the experts. Experts at the [facility] [employer] developed an idea known as the Healthy Eating Plate. Just imagine a plate divided into logical, healthy portions.   The emphasis is on diet quality:   Load up on vegetables and fruits - one-half of your plate: Aim for color and variety, and remember that potatoes  dont count.   Go for whole grains - one-quarter of your plate: Whole wheat, barley, wheat berries, quinoa, oats, brown rice, and foods made with  them. If you want pasta, go with whole wheat pasta.   Protein power - one-quarter of your plate: Fish, chicken, beans, and nuts are all healthy, versatile protein sources. Limit red meat.   The diet, however, does go beyond the plate, offering a few other suggestions.   Use healthy plant oils, such as olive, canola, soy, corn, sunflower and peanut. Check the labels, and avoid partially hydrogenated oil, which have unhealthy trans fats.   If youre thirsty, drink water. Coffee and tea are good in moderation, but skip sugary drinks and limit milk and dairy products to one or two daily servings.   The type of carbohydrate in the diet is more important than the amount. Some sources of carbohydrates, such as vegetables, fruits, whole grains, and beans-are healthier than others.   Finally, stay active  Signed, Dub Huntsman, DO  11/21/2023 9:29 AM    [facility]

## 2023-11-19 NOTE — Patient Instructions (Signed)
 Medication Instructions:  Your physician recommends that you continue on your current medications as directed. Please refer to the Current Medication list given to you today.  *If you need a refill on your cardiac medications before your next appointment, please call your pharmacy*  Lab Work: Lipids, Lp(a) If you have labs (blood work) drawn today and your tests are completely normal, you will receive your results only by: MyChart Message (if you have MyChart) OR A paper copy in the mail If you have any lab test that is abnormal or we need to change your treatment, we will call you to review the results.  Follow-Up: At [facility], you and your health needs are our priority.  As part of our continuing mission to provide you with exceptional heart care, our providers are all part of one team.  This team includes your primary Cardiologist (physician) and Advanced Practice Providers or APPs (Physician Assistants and Nurse Practitioners) who all work together to provide you with the care you need, when you need it.  Your next appointment:   1 year(s)  Provider:   Kardie Tobb, DO

## 2023-11-20 LAB — LIPID PANEL
Chol/HDL Ratio: 2.6 ratio (ref 0.0–4.4)
Cholesterol, Total: 106 mg/dL (ref 100–199)
HDL: 41 mg/dL (ref >39)
LDL Chol Calc (NIH): 50 mg/dL (ref 0–99)
Triglycerides: 74 mg/dL (ref 0–149)
VLDL Cholesterol Cal: 15 mg/dL (ref 5–40)

## 2023-11-20 LAB — LIPOPROTEIN A (LPA): Lipoprotein (a): 21.1 nmol/L (ref <75.0)

## 2023-11-23 ENCOUNTER — Ambulatory Visit: Payer: Self-pay | Admitting: Cardiology

## 2023-12-01 NOTE — Telephone Encounter (Signed)
 Results/message from Dr. Emmette Harms has been released to MyChart. A letter is being sent to the last known home address.

## 2023-12-17 DIAGNOSIS — Z23 Encounter for immunization: Secondary | ICD-10-CM | POA: Diagnosis not present

## 2024-02-03 DIAGNOSIS — M1711 Unilateral primary osteoarthritis, right knee: Secondary | ICD-10-CM | POA: Diagnosis not present

## 2024-02-03 DIAGNOSIS — M170 Bilateral primary osteoarthritis of knee: Secondary | ICD-10-CM | POA: Diagnosis not present

## 2024-02-10 NOTE — Progress Notes (Unsigned)
 GYNECOLOGY  VISIT   HPI: 78 y.o.   Married  African American female   [phone] with No LMP recorded. Patient is postmenopausal.   here for: 1 year med check - Fosamax .  Taking since end of 2023.   Occasional skipped Fosamax .  No reflux issues.    Has bilateral knee issues, but other wise no joint pain.    Takes calcium  once to twice a day.   MVI every other day.   Last Dexa 02/12/21 showed osteopenia of right femoral neck and left femoral neck.   Spine was normal.  FRAX 9.4%/4.8%  Walked dog twice a day. Likes to garden.    Has railing on her steps. Installing grab bars at home.    States her rectocele is doing ok.  She tries to control her BMs with increasing fiber.   Asking me to assess an area on the skin of her neck.    Mother had a hx of fracture.   Husband has multiple myeloma.   GYNECOLOGIC HISTORY: No LMP recorded. Patient is postmenopausal. Contraception:  Tubal  Menopausal hormone therapy:  n/a Last 2 paps:  01/01/22 neg HR HPV neg, 12/13/19 neg  History of abnormal Pap or positive HPV:  no Mammogram:  05/22/21 Breast Density Cat B, BIRADS Cat 1 neg         OB History     Gravida  4   Para  3   Term  3   Preterm      AB  1   Living  3      SAB  1   IAB      Ectopic      Multiple      Live Births                 Patient Active Problem List   Diagnosis Date Noted   Arthritis of left knee 05/14/2022   Internal hemorrhoid 04/25/2016   Chronic maxillary sinusitis 04/25/2016   Hematuria 05/04/2015   Kidney stone 06/23/2014   Multinodular goiter 09/02/2013   Diverticulosis 05/09/2013   Mixed hyperlipidemia 12/10/2011   Hypothyroidism (acquired) 12/10/2011   Essential (primary) hypertension 12/10/2011   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     ascorbic acid (C 500/ROSE HIPS) 500 MG tablet Take by mouth.     Calcium  Carbonate-Vit D-Min (CALTRATE PLUS PO) Take 1 tablet by mouth 2 (two) times daily.     fluticasone (FLONASE) 50 MCG/ACT nasal spray Place 1 spray into both nostrils daily as needed for allergies or rhinitis.     hydrocortisone (ANUSOL-HC) 2.5 % rectal cream APPLY RECTALLY TO THE AFFECTED AREA  TWICE DAILY FOR 14 DAYS 30 g 1   multivitamin (THERAGRAN) per tablet Take 1 tablet by mouth daily.       Olopatadine HCl 0.2 % SOLN 1 drop     Psyllium (METAMUCIL PO) Take by mouth every other day.     rosuvastatin  (CRESTOR ) 40 MG tablet Take 1 tablet (40 mg total) by mouth daily. 90 tablet 3   SYNTHROID 112 MCG tablet Take 112 mcg by mouth every morning.     alendronate  (FOSAMAX ) 70 MG tablet TAKE 1 TABLET(70 MG) BY MOUTH EVERY 7 DAYS WITH A FULL GLASS OF WATER AND ON AN EMPTY STOMACH 12 tablet 3   No current facility-administered medications for this visit.     ALLERGIES: Meloxicam, Sulfa antibiotics, Tramadol hcl, Kapidex [dexlansoprazole], and Sulfonamide derivatives  Family History  Problem Relation Age of Onset   Diabetes Mother    Hypertension Mother    Uterine cancer Mother    Heart disease Mother    Cancer Father        prostate   Colon cancer Paternal Aunt    Heart attack Brother    Cancer Brother        Prostate   Cancer Daughter        Thyroid  cancer   Cancer Brother        Prostate   Cancer Brother        Prostate    Social History   Socioeconomic  History   Marital status: Married    Spouse name: Not on file   Number of children: 3   Years of education: 18   Highest education level: Not on file  Occupational History   Occupation: retired   Tobacco Use   Smoking status: Never   Smokeless tobacco: Never  Vaping Use   Vaping status: Never Used  Substance and Sexual Activity   Alcohol use: Not Currently   Drug use: No   Sexual activity: Not Currently    Birth control/protection: Surgical    Comment: Tubal lig-1st intercourse 20 yo-1 partner  Other Topics Concern   Not on file  Social History Narrative   Right handed   Two story home   Drinks caffeine   Social Drivers of Health   Tobacco Use: Low Risk (02/11/2024)   Patient History    Smoking Tobacco Use: Never    Smokeless Tobacco Use: Never    Passive Exposure: Not on file  Financial Resource Strain: Low Risk  (09/01/2022)   Received from [facility]   Overall Financial Resource Strain (CARDIA)    Difficulty of Paying Living Expenses: Not hard at all  Food Insecurity: No Food Insecurity (09/01/2022)   Received from [facility]   Epic    Within the past 12 months, you worried that your food would run out before you got the money to buy more.: Never true    Within the past 12 months, the food you bought just didn't last and you didn't have money to get more.: Never true  Transportation Needs: No Transportation Needs (09/01/2022)   Received from [facility] - Transportation    In the past 12 months, has lack of transportation kept you from medical appointments or from getting medications?: No    Lack of Transportation (Non-Medical): No  Physical Activity: Not on file  Stress: Not on file  Social Connections: Not on file  Intimate Partner Violence: Not on file  Depression (EYV7-0): Not on  file  Alcohol Screen: Not on file  Housing: Low Risk  (04/05/2023)   Received from [facility]   Epic     In the last 12 months, was there a time when you were not able to pay the mortgage or rent on time?: No    In the past 12 months, how many times have you moved where you were living?: 0    At any time in the past 12 months, were you homeless or living in a shelter (including now)?: No  Utilities: Not At Risk (08/31/2022)   Received from [facility] Utilities    Threatened with loss of utilities: No  Health Literacy: Not on file    Review of Systems  All other systems reviewed and are negative.   PHYSICAL EXAMINATION:   BP 116/72 (BP Location: Left Arm, Patient Position: Sitting)   Pulse 66   Ht 5' 2.5 (1.588 m)   Wt 166 lb (75.3 kg)   SpO2 97%   BMI 29.88 kg/m     General appearance: alert, cooperative and appears stated age   Skin: right posterior superior neck with 8 mm pigmented nevus in the hairline.   ASSESSMENT:  Osteopenia.  Increased risk of hip fracture by FRAX.  Encounter for medication monitoring.  Pigmented nevus.   PLAN:  Continue Fosamax  70 mg weekly  #12, RF 3.  Calcium  1200 mg daily and vit D 600 - 800 international units daily recommended.  Weight bearing exercise reviewed.  Fall risk reduction discussed.  Next BMD at [facility].  Will fax an order.  Will check BMP with GFR and vit D level.  Will get copies of mammograms from [facility] for 2024 and 2025.  Referral to [facility] health Dermatology.  Fu 1 year for breast and pelvic exam and follow up.    37 min  total time was spent for this patient encounter, including preparation, face-to-face counseling with the patient, coordination of care, and documentation of the encounter.

## 2024-02-11 ENCOUNTER — Ambulatory Visit: Admitting: Obstetrics and Gynecology

## 2024-02-11 ENCOUNTER — Encounter: Payer: Self-pay | Admitting: Obstetrics and Gynecology

## 2024-02-11 VITALS — BP 116/72 | HR 66 | Ht 62.5 in | Wt 166.0 lb

## 2024-02-11 DIAGNOSIS — D224 Melanocytic nevi of scalp and neck: Secondary | ICD-10-CM

## 2024-02-11 DIAGNOSIS — Z7983 Long term (current) use of bisphosphonates: Secondary | ICD-10-CM

## 2024-02-11 DIAGNOSIS — Z9189 Other specified personal risk factors, not elsewhere classified: Secondary | ICD-10-CM

## 2024-02-11 DIAGNOSIS — M8589 Other specified disorders of bone density and structure, multiple sites: Secondary | ICD-10-CM | POA: Diagnosis not present

## 2024-02-11 DIAGNOSIS — Z5181 Encounter for therapeutic drug level monitoring: Secondary | ICD-10-CM

## 2024-02-11 MED ORDER — ALENDRONATE SODIUM 70 MG PO TABS: ORAL_TABLET | ORAL | 3 refills | Status: AC

## 2024-02-12 ENCOUNTER — Ambulatory Visit: Payer: Self-pay | Admitting: Obstetrics and Gynecology

## 2024-02-12 LAB — BASIC METABOLIC PANEL WITH GFR
BUN: 16 mg/dL (ref 7–25)
CO2: 30 mmol/L (ref 20–32)
Calcium: 9.3 mg/dL (ref 8.6–10.4)
Chloride: 102 mmol/L (ref 98–110)
Creat: 0.71 mg/dL (ref 0.60–1.00)
Glucose, Bld: 95 mg/dL (ref 65–99)
Potassium: 4.1 mmol/L (ref 3.5–5.3)
Sodium: 139 mmol/L (ref 135–146)
eGFR: 86 mL/min/1.73m2

## 2024-02-12 LAB — VITAMIN D 25 HYDROXY (VIT D DEFICIENCY, FRACTURES): Vit D, 25-Hydroxy: 49 ng/mL (ref 30–100)

## 2024-03-08 LAB — HM DEXA SCAN

## 2024-03-17 ENCOUNTER — Encounter: Payer: Self-pay | Admitting: Obstetrics and Gynecology

## 2024-03-25 ENCOUNTER — Ambulatory Visit: Admitting: Dermatology

## 2024-03-25 ENCOUNTER — Encounter: Payer: Self-pay | Admitting: Dermatology

## 2024-03-25 DIAGNOSIS — L820 Inflamed seborrheic keratosis: Secondary | ICD-10-CM

## 2024-03-25 DIAGNOSIS — L821 Other seborrheic keratosis: Secondary | ICD-10-CM

## 2024-03-25 NOTE — Patient Instructions (Addendum)

## 2024-03-25 NOTE — Progress Notes (Signed)
" ° °  New Patient Visit   History of Present Illness Lynn Collins is a 79 year old female who presents with a spot in her hairline. She was referred by Dr. Okey for evaluation of the spot in her hairline.  She has noticed a spot in her hairline that has been present for several years. Her husband initially noticed the spot, which she thought was related to her hair or neck. There has been no prior treatment for this spot.  The spot occasionally itches. She has not sought any treatment for this symptom before today's visit.  Her family history includes a brother who had a cancerous spot removed from his nose two years ago and a general history of moles. However, she is unsure of the specific type of skin cancer her brother had.  Pt has a growth on her neck she'd like evaluated which has been present several years. It gets itchy at times. Pt has no hx of skin cancer  The following portions of the chart were reviewed this encounter and updated as appropriate: medications, allergies, medical history  Review of Systems:  No other skin or systemic complaints except as noted in HPI or Assessment and Plan.  Objective  Well appearing patient in no apparent distress; mood and affect are within normal limits.  A focused examination was performed of the following areas: neck  Relevant exam findings are noted in the Assessment and Plan.  Scalp Stuck on papule   Assessment & Plan   SEBORRHEIC KERATOSIS - Stuck-on, waxy, tan-brown papules and/or plaques  - Benign-appearing - Discussed benign etiology and prognosis. - Observe - Call for any changes INFLAMED SEBORRHEIC KERATOSIS Scalp - Destruction of lesion - Scalp Complexity: simple   Destruction method: cryotherapy   Outcome: patient tolerated procedure well with no complications   Post-procedure details: wound care instructions given    SEBORRHEIC KERATOSIS    Return for TBSE w brenda.  I, Darice Smock, CMA, am acting as  scribe for [name], MD.   Documentation: I have reviewed the above documentation for accuracy and completeness, and I agree with the above.  [name], MD    "

## 2024-04-26 ENCOUNTER — Ambulatory Visit: Admitting: Dermatology

## 2024-06-21 ENCOUNTER — Ambulatory Visit: Admitting: Physician Assistant

## 2025-02-14 ENCOUNTER — Encounter: Admitting: Obstetrics and Gynecology
